# Patient Record
Sex: Female | Born: 1970 | Race: Black or African American | Hispanic: No | Smoking: Never smoker
Health system: Southern US, Community
[De-identification: ages and names within clinical notes are randomized; demographics above are authoritative.]

## PROBLEM LIST (undated history)

## (undated) DIAGNOSIS — I10 Essential (primary) hypertension: Secondary | ICD-10-CM

## (undated) HISTORY — PX: BREAST SURGERY: SHX581

---

## 2012-01-21 ENCOUNTER — Encounter (HOSPITAL_BASED_OUTPATIENT_CLINIC_OR_DEPARTMENT_OTHER): Payer: Self-pay | Admitting: *Deleted

## 2012-01-21 ENCOUNTER — Emergency Department (HOSPITAL_BASED_OUTPATIENT_CLINIC_OR_DEPARTMENT_OTHER)
Admission: EM | Admit: 2012-01-21 | Discharge: 2012-01-21 | Disposition: A | Discharge status: Home / Self Care | Payer: Managed Care, Other (non HMO) | Attending: Emergency Medicine | Admitting: Emergency Medicine

## 2012-01-21 DIAGNOSIS — L03211 Cellulitis of face: Secondary | ICD-10-CM | POA: Insufficient documentation

## 2012-01-21 DIAGNOSIS — L0201 Cutaneous abscess of face: Secondary | ICD-10-CM | POA: Insufficient documentation

## 2012-01-21 MED ORDER — DOXYCYCLINE HYCLATE 100 MG PO CAPS: 100.0000 mg | ORAL_CAPSULE | Freq: Two times a day (BID) | ORAL | Status: AC

## 2012-01-21 MED ORDER — TETANUS-DIPHTH-ACELL PERTUSSIS 5-2.5-18.5 LF-MCG/0.5 IM SUSP
0.5000 mL | Freq: Once | INTRAMUSCULAR | Status: AC
  Administered 2012-01-21: 0.5 mL via INTRAMUSCULAR
  Filled 2012-01-21: qty 0.5

## 2012-01-21 NOTE — Discharge Instructions (Signed)

## 2012-01-21 NOTE — ED Provider Notes (Signed)
History     CSN: [id number]  Arrival date & time 01/21/12  1650   First MD Initiated Contact with Patient 01/21/12 1702      Chief Complaint  Patient presents with  . Abscess    (Consider location/radiation/quality/duration/timing/severity/associated sxs/prior treatment) HPI Comments: Pt states that she has been picking at the area with her hands and it has increased in size:denies fever  Patient is a 41 y.o. female presenting with abscess. The history is provided by the patient. No language interpreter was used.  Abscess  This is a new problem. The current episode started less than one week ago. The onset was gradual. The problem occurs occasionally. The problem has been gradually worsening. The abscess is present on the face. The abscess is characterized by redness, swelling and draining. It is unknown what she was exposed to.    History reviewed. No pertinent past medical history.  Past Surgical History  Procedure Date  . Breast surgery     No family history on file.  History  Substance Use Topics  . Smoking status: Never Smoker   . Smokeless tobacco: Not on file  . Alcohol Use: No    OB History    Grav Para Term Preterm Abortions TAB SAB Ect Mult Living                  Review of Systems  Constitutional: Negative.   HENT:       Swelling to the right jaw  Respiratory: Negative.   Cardiovascular: Negative.   Skin: Positive for wound.    Allergies  Review of patient's allergies indicates no known allergies.  Home Medications   Current Outpatient Rx  Name Route Sig Dispense Refill  . VITAMIN C PO Oral Take 1 tablet by mouth daily.    . ASPIRIN-ACETAMINOPHEN-CAFFEINE 250-250-65 MG PO TABS Oral Take 1 tablet by mouth once as needed. For headache    . PARAGARD INTRAUTERINE COPPER IU IUD Intrauterine 1 each by Intrauterine route once. Inserted in 2012    . TRIPLE ANTIBIOTIC [phone] EX OINT Topical Apply 1 application topically 2 (two) times daily as  needed. To sore    . PHENTERMINE HCL 37.5 MG PO TABS Oral Take 37.5 mg by mouth daily before breakfast.    . DOXYCYCLINE HYCLATE 100 MG PO CAPS Oral Take 1 capsule (100 mg total) by mouth 2 (two) times daily. 14 capsule 0    BP 155/99  Pulse 89  Temp(Src) 99 F (37.2 C) (Oral)  Resp 20  SpO2 100%  Physical Exam  Nursing note and vitals reviewed. Constitutional: She appears well-developed and well-nourished.  HENT:  Right Ear: External ear normal.  Left Ear: External ear normal.  Cardiovascular: Normal rate and regular rhythm.   Pulmonary/Chest: Effort normal and breath sounds normal.  Musculoskeletal: Normal range of motion.  Neurological: She is alert.  Skin:       Pt has swelling and redness noted to the right lower jaw starting at the WUJ:WJXBJY to palpation and draining    ED Course  INCISION AND DRAINAGE Performed by: Teressa Lower Authorized by: Teressa Lower Consent: Verbal consent obtained. Written consent not obtained. Risks and benefits: risks, benefits and alternatives were discussed Consent given by: patient Patient understanding: patient states understanding of the procedure being performed Patient identity confirmed: verbally with patient Time out: Immediately prior to procedure a "time out" was called to verify the correct patient, procedure, equipment, support staff and site/side marked as required. Type: abscess Body  area: head/neck (right chin) Anesthesia: local infiltration Local anesthetic: lidocaine 2% with epinephrine Scalpel size: 11 Incision type: single straight Complexity: simple Drainage: purulent Drainage amount: scant Packing material: Vaseline gauze Patient tolerance: Patient tolerated the procedure well with no immediate complications.   (including critical care time)  Labs Reviewed - No data to display No results found.   1. Facial abscess       MDM  Pt given antibiotics and follow up for worsening  symptoms        Teressa Lower, NP 01/21/12 1743

## 2012-01-21 NOTE — ED Provider Notes (Signed)
Medical screening examination/treatment/procedure(s) were performed by non-physician practitioner and as supervising physician I was immediately available for consultation/collaboration.   Dayton Bailiff, MD 01/21/12 1816

## 2012-01-21 NOTE — ED Notes (Signed)
Abscess to the right side of her face. States it started as pimple and she tried to squeeze it and now it is hot to touch, swollen, red and painful.

## 2012-12-25 ENCOUNTER — Emergency Department (HOSPITAL_BASED_OUTPATIENT_CLINIC_OR_DEPARTMENT_OTHER)
Admission: EM | Admit: 2012-12-25 | Discharge: 2012-12-25 | Disposition: A | Discharge status: Home / Self Care | Payer: Managed Care, Other (non HMO) | Attending: Emergency Medicine | Admitting: Emergency Medicine

## 2012-12-25 ENCOUNTER — Encounter (HOSPITAL_BASED_OUTPATIENT_CLINIC_OR_DEPARTMENT_OTHER): Payer: Self-pay

## 2012-12-25 DIAGNOSIS — H01006 Unspecified blepharitis left eye, unspecified eyelid: Secondary | ICD-10-CM

## 2012-12-25 DIAGNOSIS — H01009 Unspecified blepharitis unspecified eye, unspecified eyelid: Secondary | ICD-10-CM | POA: Insufficient documentation

## 2012-12-25 DIAGNOSIS — Z79899 Other long term (current) drug therapy: Secondary | ICD-10-CM | POA: Insufficient documentation

## 2012-12-25 MED ORDER — BACITRACIN-POLYMYXIN B 500-10000 UNIT/GM OP OINT: TOPICAL_OINTMENT | OPHTHALMIC | Status: DC

## 2012-12-25 NOTE — ED Notes (Signed)
Pt states that she has been having burning, itching, and redness to OS. Pt states that she has had some discharge from the eye.

## 2012-12-25 NOTE — ED Provider Notes (Signed)
History     CSN: [id number]  Arrival date & time 12/25/12  0800   First MD Initiated Contact with Patient 12/25/12 0830      Chief Complaint  Patient presents with  . Eye Problem    (Consider location/radiation/quality/duration/timing/severity/associated sxs/prior treatment) HPI This 42yo female has several days of irritation to her left eyelids mostly the left upper eyelid after she had false eyelashes applied, she did remove the false eyelashes but she still has mild discomfort to the left upper eyelid and minimal discomfort in the left lower eyelid, she has some increased tearing but no vision change and no pain to her left eye itself she is no fever and no significant redness around her eyelids and no redness to the rest of her face, she does not feel ill at all otherwise she is no chest pain shortness breath abdominal pain vomiting or other concerns. There is no treatment prior to arrival other than removing her false eyelashes which caused her problem. History reviewed. No pertinent past medical history.  Past Surgical History  Procedure Laterality Date  . Breast surgery      History reviewed. No pertinent family history.  History  Substance Use Topics  . Smoking status: Never Smoker   . Smokeless tobacco: Not on file  . Alcohol Use: No    OB History   Grav Para Term Preterm Abortions TAB SAB Ect Mult Living                  Review of Systems 10 Systems reviewed and are negative for acute change except as noted in the HPI. Allergies  Review of patient's allergies indicates no known allergies.  Home Medications   Current Outpatient Rx  Name  Route  Sig  Dispense  Refill  . phentermine (ADIPEX-P) 37.5 MG tablet   Oral   Take 37.5 mg by mouth daily before breakfast.         . Ascorbic Acid (VITAMIN C PO)   Oral   Take 1 tablet by mouth daily.         Marland Kitchen aspirin-acetaminophen-caffeine (EXCEDRIN MIGRAINE) 250-250-65 MG per tablet   Oral   Take 1 tablet by  mouth once as needed. For headache         . bacitracin-polymyxin b (POLYSPORIN) ophthalmic ointment   Left Eye   Place into the left eye every 4 (four) hours. apply to left eye every 4 hours while awake x 14 days   3.5 g   0   . IUD's (PARAGARD INTRAUTERINE COPPER) IUD IUD   Intrauterine   1 each by Intrauterine route once. Inserted in 2012         . neomycin-bacitracin-polymyxin (NEOSPORIN) [phone] ointment   Topical   Apply 1 application topically 2 (two) times daily as needed. To sore           BP 143/79  Pulse 75  Temp(Src) 97.9 F (36.6 C) (Oral)  Resp 16  SpO2 99%  Physical Exam  Nursing note and vitals reviewed. Constitutional:  Awake, alert, nontoxic appearance.  HENT:  Head: Atraumatic.  Eyes: Right eye exhibits no discharge. Left eye exhibits no discharge.  Pupils reactive, extraocular movements intact, minimal injection to the left conjunctiva sclera, minimal edema to the left upper and lower eyelids with no external erythema or induration but minimal tenderness with eversion of the left upper lid with injection of the mucosal surface of the left upper eyelid with no foreign body noted or purulent  drainage to left eye and no periorbital tenderness with patient denying any visual change corneal foreign body sensation or globe pain  Neck: Neck supple.  Cardiovascular: Normal rate and regular rhythm.   No murmur heard. Pulmonary/Chest: Effort normal. She exhibits no tenderness.  Abdominal: Soft. There is no tenderness. There is no rebound.  Musculoskeletal: She exhibits no tenderness.  Baseline ROM, no obvious new focal weakness.  Neurological:  Mental status and motor strength appears baseline for patient and situation.  Skin: No rash noted.  Psychiatric: She has a normal mood and affect.    ED Course  Procedures (including critical care time)  Labs Reviewed - No data to display No results found.   1. Blepharitis of left eye       MDM   Patient informed of clinical course, understand medical decision-making process, and agree with plan. I doubt any other EMC precluding discharge at this time including, but not necessarily limited to the following:orbital/periorbital celllulitis.        Hurman Horn, MD 12/25/12 2046

## 2013-12-27 ENCOUNTER — Encounter (HOSPITAL_BASED_OUTPATIENT_CLINIC_OR_DEPARTMENT_OTHER): Payer: Self-pay | Admitting: Emergency Medicine

## 2013-12-27 ENCOUNTER — Emergency Department (HOSPITAL_BASED_OUTPATIENT_CLINIC_OR_DEPARTMENT_OTHER)
Admission: EM | Admit: 2013-12-27 | Discharge: 2013-12-27 | Disposition: A | Discharge status: Home / Self Care | Payer: Managed Care, Other (non HMO) | Attending: Emergency Medicine | Admitting: Emergency Medicine

## 2013-12-27 DIAGNOSIS — J069 Acute upper respiratory infection, unspecified: Secondary | ICD-10-CM | POA: Insufficient documentation

## 2013-12-27 DIAGNOSIS — Z79899 Other long term (current) drug therapy: Secondary | ICD-10-CM | POA: Insufficient documentation

## 2013-12-27 DIAGNOSIS — R1110 Vomiting, unspecified: Secondary | ICD-10-CM | POA: Insufficient documentation

## 2013-12-27 LAB — RAPID STREP SCREEN (MED CTR MEBANE ONLY): Streptococcus, Group A Screen (Direct): NEGATIVE

## 2013-12-27 NOTE — Discharge Instructions (Signed)

## 2013-12-27 NOTE — ED Notes (Signed)
Pt reports URI, cough that is causing her to vomit and generalized body aches x several days.  Denies fever at home.

## 2013-12-27 NOTE — ED Provider Notes (Signed)
CSN: [id number]     Arrival date & time 12/27/13  [id number] History   First MD Initiated Contact with Patient 12/27/13 [phone]     Chief Complaint  Patient presents with  . URI     (Consider location/radiation/quality/duration/timing/severity/associated sxs/prior Treatment) Patient is a 43 y.o. female presenting with URI.  URI  Pt with no significant PMH reports 2-3 days of nasal congestion, sore throat, productive cough, occasional post-tussive emesis and body aches. Has not had a measured fever at home. Was around a sick grandson last week. Has not taken OTC meds this morning.   History reviewed. No pertinent past medical history. Past Surgical History  Procedure Laterality Date  . Breast surgery     History reviewed. No pertinent family history. History  Substance Use Topics  . Smoking status: Never Smoker   . Smokeless tobacco: Not on file  . Alcohol Use: No   OB History   Grav Para Term Preterm Abortions TAB SAB Ect Mult Living                 Review of Systems All other systems reviewed and are negative except as noted in HPI.     Allergies  Review of patient's allergies indicates no known allergies.  Home Medications   Prior to Admission medications   Medication Sig Start Date End Date Taking? Authorizing Provider  phentermine (ADIPEX-P) 37.5 MG tablet Take 37.5 mg by mouth daily before breakfast.    Historical Provider, MD   BP 130/72  Pulse 92  Temp(Src) 99.9 F (37.7 C) (Oral)  Resp 18  Ht 5\' 6"  (1.676 m)  Wt 180 lb (81.647 kg)  BMI 29.07 kg/m2  SpO2 100% Physical Exam  Nursing note and vitals reviewed. Constitutional: She is oriented to person, place, and time. She appears well-developed and well-nourished.  HENT:  Head: Normocephalic and atraumatic.  Mouth/Throat: No oropharyngeal exudate.  Eyes: EOM are normal. Pupils are equal, round, and reactive to light.  Neck: Normal range of motion. Neck supple.  Cardiovascular: Normal rate, normal heart  sounds and intact distal pulses.   Pulmonary/Chest: Effort normal and breath sounds normal.  Abdominal: Bowel sounds are normal. She exhibits no distension. There is no tenderness.  Musculoskeletal: Normal range of motion. She exhibits no edema and no tenderness.  Lymphadenopathy:    She has no cervical adenopathy.  Neurological: She is alert and oriented to person, place, and time. She has normal strength. No cranial nerve deficit or sensory deficit.  Skin: Skin is warm and dry. No rash noted.  Psychiatric: She has a normal mood and affect.    ED Course  Procedures (including critical care time) Labs Review Labs Reviewed  RAPID STREP SCREEN  CULTURE, GROUP A STREP    Imaging Review No results found.   EKG Interpretation None      MDM   Final diagnoses:  Viral URI    Strep neg, likely viral URI. Recommend rest, fluids and OTC symptomatic relief.     Charles B. Bernette MayersSheldon, MD 12/27/13 1013

## 2013-12-27 NOTE — ED Notes (Signed)
MD at bedside. 

## 2013-12-29 LAB — CULTURE, GROUP A STREP

## 2015-02-26 ENCOUNTER — Emergency Department (HOSPITAL_COMMUNITY): Payer: Managed Care, Other (non HMO)

## 2015-02-26 ENCOUNTER — Emergency Department (HOSPITAL_COMMUNITY)
Admission: EM | Admit: 2015-02-26 | Discharge: 2015-02-26 | Disposition: A | Discharge status: Home / Self Care | Payer: Managed Care, Other (non HMO) | Attending: Emergency Medicine | Admitting: Emergency Medicine

## 2015-02-26 ENCOUNTER — Encounter (HOSPITAL_COMMUNITY): Payer: Self-pay

## 2015-02-26 DIAGNOSIS — S92252A Displaced fracture of navicular [scaphoid] of left foot, initial encounter for closed fracture: Secondary | ICD-10-CM

## 2015-02-26 DIAGNOSIS — S3992XA Unspecified injury of lower back, initial encounter: Secondary | ICD-10-CM | POA: Diagnosis not present

## 2015-02-26 DIAGNOSIS — Y998 Other external cause status: Secondary | ICD-10-CM | POA: Insufficient documentation

## 2015-02-26 DIAGNOSIS — S99922A Unspecified injury of left foot, initial encounter: Secondary | ICD-10-CM | POA: Diagnosis present

## 2015-02-26 DIAGNOSIS — M545 Low back pain, unspecified: Secondary | ICD-10-CM

## 2015-02-26 DIAGNOSIS — Y9389 Activity, other specified: Secondary | ICD-10-CM | POA: Diagnosis not present

## 2015-02-26 DIAGNOSIS — S299XXA Unspecified injury of thorax, initial encounter: Secondary | ICD-10-CM | POA: Insufficient documentation

## 2015-02-26 DIAGNOSIS — I10 Essential (primary) hypertension: Secondary | ICD-10-CM | POA: Insufficient documentation

## 2015-02-26 DIAGNOSIS — S30810A Abrasion of lower back and pelvis, initial encounter: Secondary | ICD-10-CM | POA: Diagnosis not present

## 2015-02-26 DIAGNOSIS — Y92410 Unspecified street and highway as the place of occurrence of the external cause: Secondary | ICD-10-CM | POA: Insufficient documentation

## 2015-02-26 DIAGNOSIS — Z79899 Other long term (current) drug therapy: Secondary | ICD-10-CM | POA: Insufficient documentation

## 2015-02-26 HISTORY — DX: Essential (primary) hypertension: I10

## 2015-02-26 MED ORDER — HYDROCODONE-ACETAMINOPHEN 5-325 MG PO TABS
1.0000 | ORAL_TABLET | Freq: Once | ORAL | Status: AC
Start: 2015-02-26 — End: 2015-02-26
  Administered 2015-02-26: 1 via ORAL
  Filled 2015-02-26: qty 1

## 2015-02-26 MED ORDER — METHOCARBAMOL 500 MG PO TABS: 500.0000 mg | ORAL_TABLET | Freq: Three times a day (TID) | ORAL | Status: DC | PRN

## 2015-02-26 MED ORDER — IBUPROFEN 600 MG PO TABS
600.0000 mg | ORAL_TABLET | Freq: Three times a day (TID) | ORAL | Status: AC | PRN
Start: 2015-02-26 — End: ?

## 2015-02-26 MED ORDER — IBUPROFEN 200 MG PO TABS
600.0000 mg | ORAL_TABLET | Freq: Once | ORAL | Status: AC
  Administered 2015-02-26: 600 mg via ORAL
  Filled 2015-02-26: qty 3

## 2015-02-26 MED ORDER — DIAZEPAM 5 MG PO TABS
5.0000 mg | ORAL_TABLET | Freq: Once | ORAL | Status: AC
  Administered 2015-02-26: 5 mg via ORAL
  Filled 2015-02-26: qty 1
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
## 2015-02-26 NOTE — ED Notes (Signed)
Pt reporting pain and swelling in left foot. MD aware.
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
## 2017-06-11 ENCOUNTER — Emergency Department (HOSPITAL_BASED_OUTPATIENT_CLINIC_OR_DEPARTMENT_OTHER): Payer: 59

## 2017-06-11 ENCOUNTER — Encounter (HOSPITAL_BASED_OUTPATIENT_CLINIC_OR_DEPARTMENT_OTHER): Payer: Self-pay | Admitting: Emergency Medicine

## 2017-06-11 ENCOUNTER — Emergency Department (HOSPITAL_BASED_OUTPATIENT_CLINIC_OR_DEPARTMENT_OTHER)
Admission: EM | Admit: 2017-06-11 | Discharge: 2017-06-11 | Disposition: A | Discharge status: Home / Self Care | Payer: 59 | Attending: Emergency Medicine | Admitting: Emergency Medicine

## 2017-06-11 DIAGNOSIS — B9789 Other viral agents as the cause of diseases classified elsewhere: Secondary | ICD-10-CM | POA: Diagnosis not present

## 2017-06-11 DIAGNOSIS — I10 Essential (primary) hypertension: Secondary | ICD-10-CM | POA: Diagnosis not present

## 2017-06-11 DIAGNOSIS — J069 Acute upper respiratory infection, unspecified: Secondary | ICD-10-CM | POA: Diagnosis not present

## 2017-06-11 DIAGNOSIS — R05 Cough: Secondary | ICD-10-CM | POA: Diagnosis present

## 2017-06-11 NOTE — ED Triage Notes (Signed)
Cough, runny nose and congestion x 1 week. Took OTC sinus med at 0500

## 2017-06-11 NOTE — Discharge Instructions (Signed)
Please read instructions below. You can take tylenol as needed for body aches. Use saline nasal spray or flonase for congestion. Drink plenty of water. Follow up with your primary care provider as needed if symptoms persist. Return to the ER for difficulty swallowing liquids, difficulty breathing, or new or worsening symptoms.

## 2017-06-11 NOTE — ED Provider Notes (Signed)
[facility] Provider Note   CSN: [id number] Arrival date & time: 06/11/17  6295     History   Chief Complaint Chief Complaint  Patient presents with  . URI    HPI Traci Austin is a 46 y.o. female w PMHx HTN, presenting to the ED With gradually worsening ingestion and myalgias. Patient states she had a cold last week, with 3 days of symptom improvement and relief, however began feeling ill again this weekend. She reports taking over-the-counter sinus medication and Alka-Seltzer this morning, with moderate relief of symptoms. She reports associated productive cough of white sputum, myalgias, and headache. She denies fever, vision changes, chest pain or shortness of breath, difficulty swallowing, sore throat, ear pain, neck pain or stiffness, abdominal pain, urinary symptoms, or other complaints today.  The history is provided by the patient.    Past Medical History:  Diagnosis Date  . Hypertension     There are no active problems to display for this patient.   Past Surgical History:  Procedure Laterality Date  . BREAST SURGERY      OB History    No data available       Home Medications    Prior to Admission medications   Medication Sig Start Date End Date Taking? Authorizing Provider  acetaminophen (TYLENOL) 325 MG tablet Take 650 mg by mouth every 6 (six) hours as needed for mild pain.    [provider]  ibuprofen (ADVIL,MOTRIN) 600 MG tablet Take 1 tablet (600 mg total) by mouth every 8 (eight) hours as needed. 02/26/15   Azalia Bilis, MD  methocarbamol (ROBAXIN) 500 MG tablet Take 1 tablet (500 mg total) by mouth every 8 (eight) hours as needed for muscle spasms. 02/26/15   Azalia Bilis, MD    Family History No family history on file.  Social History Social History  Substance Use Topics  . Smoking status: Never Smoker  . Smokeless tobacco: Never Used  . Alcohol use No     Allergies   Patient has no known allergies.   Review of  Systems Review of Systems  Constitutional: Negative for chills and fever.  HENT: Positive for congestion. Negative for ear pain, rhinorrhea, sore throat, trouble swallowing and voice change.   Eyes: Negative for visual disturbance.  Respiratory: Positive for cough. Negative for shortness of breath.   Cardiovascular: Negative for chest pain.  Gastrointestinal: Negative for abdominal pain.  Genitourinary: Negative for dysuria and frequency.  Musculoskeletal: Positive for myalgias. Negative for neck pain and neck stiffness.  Neurological: Positive for headaches.     Physical Exam Updated Vital Signs BP 132/71   Pulse 91   Temp 99 F (37.2 C) (Oral)   Resp 18   Ht  (1.676 m)   Wt 76.2 kg (168 lb)   SpO2 100%   BMI 27.12 kg/m   Physical Exam  Constitutional: She is oriented to person, place, and time. She appears well-developed and well-nourished. No distress.  Tolerating secretions  HENT:  Head: Normocephalic and atraumatic.  Right Ear: Hearing and external ear normal.  Left Ear: Hearing and external ear normal.  Nose: Nose normal.  Mouth/Throat: Uvula is midline. No trismus in the jaw. No uvula swelling. Posterior oropharyngeal erythema (Mild) present. No posterior oropharyngeal edema. No tonsillar exudate.  Unable to visualize TMs secondary to cerumen.  Eyes: Pupils are equal, round, and reactive to light. Conjunctivae and EOM are normal.  Neck: Normal range of motion. Neck supple. No tracheal deviation present.  Cardiovascular: Normal rate, regular rhythm, normal heart sounds and intact distal pulses.  Exam reveals no friction rub.   No murmur heard. Pulmonary/Chest: Effort normal and breath sounds normal. No stridor. No respiratory distress. She has no wheezes. She has no rales.  Abdominal: Soft. Bowel sounds are normal. She exhibits no distension. There is no tenderness.  Lymphadenopathy:    She has cervical adenopathy (mild).  Neurological: She is alert and  oriented to person, place, and time.  Psychiatric: She has a normal mood and affect. Her behavior is normal.  Nursing note and vitals reviewed.    ED Treatments / Results  Labs (all labs ordered are listed, but only abnormal results are displayed) Labs Reviewed - No data to display  EKG  EKG Interpretation None       Radiology Dg Chest 2 View  Result Date: 06/11/2017 CLINICAL DATA:  Cough.  Headache.  Chills. EXAM: CHEST  2 VIEW COMPARISON:  Thoracic spine radiographs 02/26/2015 FINDINGS: The cardiomediastinal silhouette is within normal limits. The lungs are well inflated and clear. There is no evidence of pleural effusion or pneumothorax. No acute osseous abnormality is identified. IMPRESSION: No active cardiopulmonary disease. Electronically Signed   By: Sebastian Ache M.D.   On: 06/11/2017 09:37    Procedures Procedures (including critical care time)  Medications Ordered in ED Medications - No data to display   Initial Impression / Assessment and Plan / ED Course  I have reviewed the triage vital signs and the nursing notes.  Pertinent labs & imaging results that were available during my care of the patient were reviewed by me and considered in my medical decision making (see chart for details).      Patients symptoms are consistent with URI, likely viral etiology. Pt CXR negative for acute infiltrate. Discussed that antibiotics are not indicated for viral infections. Pt will be discharged with symptomatic treatment.  Verbalizes understanding and is agreeable with plan. Pt is hemodynamically stable & in NAD prior to dc.  Discussed results, findings, treatment and follow up. Patient advised of return precautions. Patient verbalized understanding and agreed with plan.  Final Clinical Impressions(s) / ED Diagnoses   Final diagnoses:  Viral URI with cough    New Prescriptions New Prescriptions   No medications on file     Russo, [place] N, PA-C 06/11/17 1610      Tegeler, Canary Brim, MD 06/11/17 1940

## 2019-06-08 ENCOUNTER — Other Ambulatory Visit (HOSPITAL_COMMUNITY): Payer: Self-pay | Admitting: Plastic Surgery

## 2019-06-08 DIAGNOSIS — Z9889 Other specified postprocedural states: Secondary | ICD-10-CM

## 2019-06-11 ENCOUNTER — Other Ambulatory Visit: Payer: Self-pay | Admitting: Student

## 2019-06-11 ENCOUNTER — Encounter (HOSPITAL_COMMUNITY): Payer: Self-pay

## 2019-06-14 ENCOUNTER — Encounter (HOSPITAL_COMMUNITY): Payer: Self-pay

## 2019-06-14 ENCOUNTER — Telehealth (HOSPITAL_COMMUNITY): Payer: Self-pay

## 2019-06-14 ENCOUNTER — Other Ambulatory Visit (HOSPITAL_COMMUNITY): Payer: Self-pay | Admitting: Plastic Surgery

## 2019-06-14 ENCOUNTER — Ambulatory Visit (HOSPITAL_COMMUNITY)
Admission: RE | Admit: 2019-06-14 | Discharge: 2019-06-14 | Disposition: A | Discharge status: Home / Self Care | Payer: 59 | Source: Ambulatory Visit | Attending: Plastic Surgery | Admitting: Plastic Surgery

## 2019-06-14 ENCOUNTER — Other Ambulatory Visit: Payer: Self-pay | Admitting: Radiology

## 2019-06-14 ENCOUNTER — Other Ambulatory Visit: Payer: Self-pay

## 2019-06-14 DIAGNOSIS — I10 Essential (primary) hypertension: Secondary | ICD-10-CM | POA: Insufficient documentation

## 2019-06-14 DIAGNOSIS — L7632 Postprocedural hematoma of skin and subcutaneous tissue following other procedure: Secondary | ICD-10-CM | POA: Diagnosis not present

## 2019-06-14 DIAGNOSIS — Z9889 Other specified postprocedural states: Secondary | ICD-10-CM

## 2019-06-14 HISTORY — PX: IR US GUIDE BX ASP/DRAIN: IMG2392

## 2019-06-14 LAB — CBC
HCT: 33.2 % — ABNORMAL LOW (ref 36.0–46.0)
Hemoglobin: 10.7 g/dL — ABNORMAL LOW (ref 12.0–15.0)
MCH: 29.7 pg (ref 26.0–34.0)
MCHC: 32.2 g/dL (ref 30.0–36.0)
MCV: 92.2 fL (ref 80.0–100.0)
Platelets: 286 10*3/uL (ref 150–400)
RBC: 3.6 MIL/uL — ABNORMAL LOW (ref 3.87–5.11)
RDW: 13 % (ref 11.5–15.5)
WBC: 4.5 10*3/uL (ref 4.0–10.5)
nRBC: 0 % (ref 0.0–0.2)

## 2019-06-14 LAB — PROTIME-INR
INR: 1 (ref 0.8–1.2)
Prothrombin Time: 13.1 seconds (ref 11.4–15.2)

## 2019-06-14 LAB — PREGNANCY, URINE: Preg Test, Ur: NEGATIVE

## 2019-06-14 MED ORDER — FENTANYL CITRATE (PF) 100 MCG/2ML IJ SOLN
INTRAMUSCULAR | Status: AC
  Filled 2019-06-14: qty 2

## 2019-06-14 MED ORDER — SODIUM CHLORIDE 0.9 % IV SOLN: INTRAVENOUS | Status: DC

## 2019-06-14 MED ORDER — CEFAZOLIN SODIUM-DEXTROSE 2-4 GM/100ML-% IV SOLN
INTRAVENOUS | Status: AC
  Filled 2019-06-14: qty 100

## 2019-06-14 MED ORDER — MIDAZOLAM HCL 2 MG/2ML IJ SOLN
INTRAMUSCULAR | Status: AC | PRN
  Administered 2019-06-14: 0.5 mg via INTRAVENOUS
  Administered 2019-06-14: 1 mg via INTRAVENOUS

## 2019-06-14 MED ORDER — MIDAZOLAM HCL 2 MG/2ML IJ SOLN
INTRAMUSCULAR | Status: AC
  Filled 2019-06-14: qty 2

## 2019-06-14 MED ORDER — LIDOCAINE HCL 1 % IJ SOLN
INTRAMUSCULAR | Status: AC
  Filled 2019-06-14: qty 20

## 2019-06-14 MED ORDER — LIDOCAINE HCL 1 % IJ SOLN
INTRAMUSCULAR | Status: AC | PRN
  Administered 2019-06-14: 5 mL

## 2019-06-14 MED ORDER — FENTANYL CITRATE (PF) 100 MCG/2ML IJ SOLN
INTRAMUSCULAR | Status: AC | PRN
  Administered 2019-06-14: 25 ug via INTRAVENOUS
  Administered 2019-06-14: 50 ug via INTRAVENOUS

## 2019-06-14 MED ORDER — CEFAZOLIN SODIUM-DEXTROSE 2-4 GM/100ML-% IV SOLN
2.0000 g | Freq: Once | INTRAVENOUS | Status: AC
  Administered 2019-06-14: 11:00:00 2 g via INTRAVENOUS

## 2019-06-14 NOTE — Telephone Encounter (Signed)
-----   Message from Lenore Cordia sent at 06/14/2019 10:40 AM EDT ----- Regarding: FW: US Abdominal Aspiration  ----- Message ----- From: Lenore Cordia Sent: 06/11/2019   1:03 PM EDT To: Joanell Rising Subject: FW: US Abdominal Aspiration                     ----- Message ----- From: Aletta Edouard, MD Sent: 06/10/2019   5:36 PM EDT To: Lenore Cordia Subject: RE: US Abdominal Aspiration                    Evaluate with soft tissue US first for possible abdominal wall seroma. If fluid collection, ok for US guided aspiration.  GY  ----- Message ----- From: Lenore Cordia Sent: 06/10/2019   3:13 PM EDT To: [facility] Procedure Requests Subject: US Abdominal Aspiration                        Procedure Requested: [place] of Abdomen with possible aspiration and placement of drainage tube    Reason for Procedure: patient recently had and abdominoplasty surgery on 01/21/2019    Provider Requesting: Dr Audrea Muscat Contogiannis  Provider Telephone: [phone]    Other Info:

## 2019-06-14 NOTE — Progress Notes (Signed)
Pt states understanding of instructions. Bandaid over site with small bloody spot but not spreading. Offers no complaints.

## 2019-06-14 NOTE — H&P (Signed)
Chief Complaint: Patient was seen in consultation today for abdominal fluid collection aspiration/drain placement at the request of Contogiannis,Mary Ann  Referring Physician(s): Contogiannis,Mary Ann  Supervising Physician: Malachy Moan  Patient Status: [facility] - Out-pt  History of Present Illness: Traci Austin is a 48 y.o. female   Abdominoplasty 01/21/19 Persistent distension; pain "fluid collection" per notes No imaging available Probable seroma  Dr Sherald Hess discussed with Dr Grace Isaac Now scheduled for collection aspiration/ drain placement  Past Medical History:  Diagnosis Date  . Hypertension     Past Surgical History:  Procedure Laterality Date  . BREAST SURGERY      Allergies: Patient has no known allergies.  Medications: Prior to Admission medications   Medication Sig Start Date End Date Taking? Authorizing Provider  acetaminophen (TYLENOL) 325 MG tablet Take 650 mg by mouth every 6 (six) hours as needed for mild pain.    [provider]  ibuprofen (ADVIL,MOTRIN) 600 MG tablet Take 1 tablet (600 mg total) by mouth every 8 (eight) hours as needed. 02/26/15   Azalia Bilis, MD  methocarbamol (ROBAXIN) 500 MG tablet Take 1 tablet (500 mg total) by mouth every 8 (eight) hours as needed for muscle spasms. 02/26/15   Azalia Bilis, MD     History reviewed. No pertinent family history.  Social History   Socioeconomic History  . Marital status: Divorced    Spouse name: Not on file  . Number of children: Not on file  . Years of education: Not on file  . Highest education level: Not on file  Occupational History  . Not on file  Social Needs  . Financial resource strain: Not on file  . Food insecurity    Worry: Not on file    Inability: Not on file  . Transportation needs    Medical: Not on file    Non-medical: Not on file  Tobacco Use  . Smoking status: Never Smoker  . Smokeless tobacco: Never Used  Substance and Sexual Activity  .  Alcohol use: No  . Drug use: No  . Sexual activity: Not on file  Lifestyle  . Physical activity    Days per week: Not on file    Minutes per session: Not on file  . Stress: Not on file  Relationships  . Social Musician on phone: Not on file    Gets together: Not on file    Attends religious service: Not on file    Active member of club or organization: Not on file    Attends meetings of clubs or organizations: Not on file    Relationship status: Not on file  Other Topics Concern  . Not on file  Social History Narrative  . Not on file    Review of Systems: A 12 point ROS discussed and pertinent positives are indicated in the HPI above.  All other systems are negative.  Review of Systems  Constitutional: Positive for activity change, appetite change and unexpected weight change. Negative for fatigue.  Respiratory: Negative for cough and shortness of breath.   Gastrointestinal: Positive for abdominal distention and abdominal pain.  Neurological: Negative for weakness.  Psychiatric/Behavioral: Negative for behavioral problems and confusion.    Vital Signs: BP (!) 158/87   Pulse 64   Temp (!) 97.3 F (36.3 C) (Skin)   Resp 18   Ht 5\' 6"  (1.676 m)   Wt 172 lb (78 kg)   SpO2 100%   BMI 27.76 kg/m   Physical  Exam Vitals signs reviewed.  Cardiovascular:     Rate and Rhythm: Normal rate and regular rhythm.     Heart sounds: Normal heart sounds.  Pulmonary:     Effort: Pulmonary effort is normal.     Breath sounds: Normal breath sounds.  Abdominal:     General: There is distension.     Tenderness: There is abdominal tenderness.  Musculoskeletal: Normal range of motion.  Skin:    General: Skin is warm and dry.  Neurological:     Mental Status: She is alert and oriented to person, place, and time.  Psychiatric:        Mood and Affect: Mood normal.        Behavior: Behavior normal.        Thought Content: Thought content normal.        Judgment: Judgment  normal.     Imaging: No results found.  Labs:  CBC: No results for input(s): WBC, HGB, HCT, PLT in the last 8760 hours.  COAGS: No results for input(s): INR, APTT in the last 8760 hours.  BMP: No results for input(s): NA, K, CL, CO2, GLUCOSE, BUN, CALCIUM, CREATININE, GFRNONAA, GFRAA in the last 8760 hours.  Invalid input(s): CMP  LIVER FUNCTION TESTS: No results for input(s): BILITOT, AST, ALT, ALKPHOS, PROT, ALBUMIN in the last 8760 hours.  TUMOR MARKERS: No results for input(s): AFPTM, CEA, CA199, CHROMGRNA in the last 8760 hours.  Assessment and Plan:  Post surgical abdominoplasty 01/21/19 Abdominal distension and pain Fluid collection per Dr Kirk Ruths She discussed with Dr Pascal Lux--- Scheduled for aspiration/drain placement in IR Pt is aware of procedure benefits and risks including but not limited to Infection; bleeding; damage to surrounding structures Agreeable to proceed Consent signed in chart  Thank you for this interesting consult.  I greatly enjoyed meeting [employer] and look forward to participating in their care.  A copy of this report was sent to the requesting provider on this date.  Electronically Signed: Lavonia Drafts, PA-C 06/14/2019, 10:34 AM   I spent a total of  30 Minutes   in face to face in clinical consultation, greater than 50% of which was counseling/coordinating care for abdominal fluid collection aspiration/drain placement

## 2019-06-14 NOTE — Discharge Instructions (Signed)
Moderate Conscious Sedation, Adult, Care After These instructions provide you with information about caring for yourself after your procedure. Your health care provider may also give you more specific instructions. Your treatment has been planned according to current medical practices, but problems sometimes occur. Call your health care provider if you have any problems or questions after your procedure. What can I expect after the procedure? After your procedure, it is common:  To feel sleepy for several hours.  To feel clumsy and have poor balance for several hours.  To have poor judgment for several hours.  To vomit if you eat too soon. Follow these instructions at home: For at least 24 hours after the procedure:   Do not: ? Participate in activities where you could fall or become injured. ? Drive. ? Use heavy machinery. ? Drink alcohol. ? Take sleeping pills or medicines that cause drowsiness. ? Make important decisions or sign legal documents. ? Take care of children on your own.  Rest. Eating and drinking  Follow the diet recommended by your health care provider.  If you vomit: ? Drink water, juice, or soup when you can drink without vomiting. ? Make sure you have little or no nausea before eating solid foods. General instructions  Have a responsible adult stay with you until you are awake and alert.  Take over-the-counter and prescription medicines only as told by your health care provider.  If you smoke, do not smoke without supervision.  Keep all follow-up visits as told by your health care provider. This is important. Contact a health care provider if:  You keep feeling nauseous or you keep vomiting.  You feel light-headed.  You develop a rash.  You have a fever. Get help right away if:  You have trouble breathing. This information is not intended to replace advice given to you by your health care provider. Make sure you discuss any questions you have  with your health care provider. Document Released: 06/16/2013 Document Revised: 08/08/2017 Document Reviewed: 12/16/2015 Elsevier Patient Education  2020 [place].  Assess wound daily for signs of infection...redness, swelling, fever and report to your doctor if so. Ok to shower tomorrow and remove bandage. Limit strenuous activity for 5 days.

## 2019-06-14 NOTE — Procedures (Signed)
Interventional Radiology Procedure Note  Procedure: US aspiration of abdominal wall hematoma. Several hundred CC's removed.   Complications: None  Estimated Blood Loss: None  Recommendations: - DC home  Signed,  Criselda Peaches, MD

## 2019-06-17 ENCOUNTER — Other Ambulatory Visit (HOSPITAL_COMMUNITY): Payer: Self-pay | Admitting: Plastic Surgery

## 2019-06-17 ENCOUNTER — Other Ambulatory Visit: Payer: Self-pay | Admitting: Plastic Surgery

## 2019-06-17 DIAGNOSIS — R198 Other specified symptoms and signs involving the digestive system and abdomen: Secondary | ICD-10-CM

## 2019-06-21 ENCOUNTER — Other Ambulatory Visit: Payer: Self-pay

## 2019-06-21 ENCOUNTER — Encounter (HOSPITAL_COMMUNITY): Payer: Self-pay

## 2019-06-21 ENCOUNTER — Ambulatory Visit (HOSPITAL_COMMUNITY)
Admission: RE | Admit: 2019-06-21 | Discharge: 2019-06-21 | Disposition: A | Discharge status: Home / Self Care | Payer: 59 | Source: Ambulatory Visit | Attending: Plastic Surgery | Admitting: Plastic Surgery

## 2019-06-21 DIAGNOSIS — R198 Other specified symptoms and signs involving the digestive system and abdomen: Secondary | ICD-10-CM

## 2019-06-21 MED ORDER — SODIUM CHLORIDE (PF) 0.9 % IJ SOLN
INTRAMUSCULAR | Status: AC
  Filled 2019-06-21: qty 50

## 2019-06-21 MED ORDER — IOHEXOL 300 MG/ML  SOLN
100.0000 mL | Freq: Once | INTRAMUSCULAR | Status: AC | PRN
  Administered 2019-06-21: 100 mL via INTRAVENOUS

## 2019-07-16 ENCOUNTER — Other Ambulatory Visit (HOSPITAL_COMMUNITY): Payer: Self-pay | Admitting: Plastic Surgery

## 2019-07-16 DIAGNOSIS — R188 Other ascites: Secondary | ICD-10-CM

## 2019-07-19 ENCOUNTER — Encounter (HOSPITAL_COMMUNITY): Payer: Self-pay | Admitting: Radiology

## 2019-07-19 NOTE — Progress Notes (Unsigned)
[employer] Female, 48 y.o., 02/15/1971 MRN:  [id number] Phone:  [phone] Jerilynn Mages) PCP:  Patient, No Pcp Per Coverage:  [employer] Nap Next Appt With Radiology ([facility] 2) 07/23/2019 at 8:00 AM  RE: US Aspiration Received: 3 days ago Message Contents  Markus Daft, MD  Jillyn Hidden        Ok to schedule drain. Dr. Nathanial Rancher wants a drain and possible sclerotherapy. Would schedule for drain placement at this time.   [place]   Previous Messages  ----- Message -----  From: Garth Bigness D  Sent: 07/16/2019 12:17 PM EST  To: Markus Daft, MD  Subject: US Aspiration                   Procedure:  US Aspiration   Reason: Fluid Collection, Abscess, Patient had an Abdominoplasty surgery on 01/21/2019   History:  CT, IR US Guided BX/Drain in computer   Dr. Nathanial Rancher, [place]  [phone]

## 2019-07-22 ENCOUNTER — Other Ambulatory Visit: Payer: Self-pay | Admitting: Physician Assistant

## 2019-07-23 ENCOUNTER — Other Ambulatory Visit: Payer: Self-pay | Admitting: Plastic Surgery

## 2019-07-23 ENCOUNTER — Other Ambulatory Visit: Payer: Self-pay

## 2019-07-23 ENCOUNTER — Ambulatory Visit (HOSPITAL_COMMUNITY)
Admission: RE | Admit: 2019-07-23 | Discharge: 2019-07-23 | Disposition: A | Discharge status: Home / Self Care | Payer: 59 | Source: Ambulatory Visit | Attending: Plastic Surgery | Admitting: Plastic Surgery

## 2019-07-23 VITALS — BP 117/86 | HR 64 | Temp 97.7°F | Resp 16 | Ht 66.0 in | Wt 182.0 lb

## 2019-07-23 DIAGNOSIS — T8143XA Infection following a procedure, organ and space surgical site, initial encounter: Secondary | ICD-10-CM | POA: Diagnosis present

## 2019-07-23 DIAGNOSIS — R188 Other ascites: Secondary | ICD-10-CM | POA: Diagnosis present

## 2019-07-23 LAB — CBC
HCT: 34.4 % — ABNORMAL LOW (ref 36.0–46.0)
Hemoglobin: 11.1 g/dL — ABNORMAL LOW (ref 12.0–15.0)
MCH: 29.7 pg (ref 26.0–34.0)
MCHC: 32.3 g/dL (ref 30.0–36.0)
MCV: 92 fL (ref 80.0–100.0)
Platelets: 324 10*3/uL (ref 150–400)
RBC: 3.74 MIL/uL — ABNORMAL LOW (ref 3.87–5.11)
RDW: 13.8 % (ref 11.5–15.5)
WBC: 3.8 10*3/uL — ABNORMAL LOW (ref 4.0–10.5)
nRBC: 0 % (ref 0.0–0.2)

## 2019-07-23 LAB — PROTIME-INR
INR: 0.9 (ref 0.8–1.2)
Prothrombin Time: 12.4 seconds (ref 11.4–15.2)

## 2019-07-23 LAB — PREGNANCY, URINE: Preg Test, Ur: NEGATIVE

## 2019-07-23 MED ORDER — MIDAZOLAM HCL 2 MG/2ML IJ SOLN
INTRAMUSCULAR | Status: AC | PRN
  Administered 2019-07-23: 1 mg via INTRAVENOUS

## 2019-07-23 MED ORDER — SODIUM CHLORIDE 0.9% FLUSH: 5.0000 mL | Freq: Three times a day (TID) | INTRAVENOUS | Status: DC

## 2019-07-23 MED ORDER — MIDAZOLAM HCL 2 MG/2ML IJ SOLN
INTRAMUSCULAR | Status: AC
  Filled 2019-07-23: qty 2

## 2019-07-23 MED ORDER — FENTANYL CITRATE (PF) 100 MCG/2ML IJ SOLN
INTRAMUSCULAR | Status: AC
  Filled 2019-07-23: qty 2

## 2019-07-23 MED ORDER — LIDOCAINE HCL (PF) 1 % IJ SOLN
INTRAMUSCULAR | Status: AC
  Filled 2019-07-23: qty 30

## 2019-07-23 MED ORDER — SODIUM CHLORIDE 0.9 % IV SOLN: INTRAVENOUS | Status: DC

## 2019-07-23 NOTE — H&P (Signed)
Chief Complaint: Patient was seen in consultation today for image guided abscess aspiration/possible drain placement.  Referring Physician(s): Austin,Traci Ann  Supervising Physician: Gilmer Austin, Traci  [facility]  History of Present Illness: Traci Austin is a 48 y.o. female with a past medical history significant for HTN, abdominoplasty 01/21/19 with Dr. Sherald Austin and US guided aspiration of abdominal wall hematoma 06/14/19 with Dr. Archer Austin who presents today for a repeat image guided abscess aspiration with likely drain placement. Ms. Traci Austin reports that she continues to have abdominal distention and some pain which is mild and described as soreness. She states that after her previous procedure in IR she felt much better, however the swelling quickly returned. She is concerned about having a drain again as she previously had a JP drain after her surgery which she did not like having. She is frustrated that she continues to have recurrent swelling/fluid accumulation after her surgery and states that she was unaware that this could happen. She also expresses frustration that it was never explained to her what this is and why it keeps coming back. Overall she states understanding of indications for procedure and wishes to proceed.   Past Medical History:  Diagnosis Date  . Hypertension     Past Surgical History:  Procedure Laterality Date  . BREAST SURGERY    . IR US GUIDE BX ASP/DRAIN  06/14/2019    Allergies: Patient has no known allergies.  Medications: Prior to Admission medications   Medication Sig Start Date End Date Taking? Authorizing Provider  acetaminophen (TYLENOL) 325 MG tablet Take 650 mg by mouth every 6 (six) hours as needed for mild pain.    [provider]  ibuprofen (ADVIL,MOTRIN) 600 MG tablet Take 1 tablet (600 mg total) by mouth every 8 (eight) hours as needed. 02/26/15   Azalia Austin, Kevin, MD  methocarbamol (ROBAXIN) 500 MG tablet  Take 1 tablet (500 mg total) by mouth every 8 (eight) hours as needed for muscle spasms. 02/26/15   Azalia Austin, Kevin, MD     No family history on file.  Social History   Socioeconomic History  . Marital status: Divorced    Spouse name: Not on file  . Number of children: Not on file  . Years of education: Not on file  . Highest education level: Not on file  Occupational History  . Not on file  Social Needs  . Financial resource strain: Not on file  . Food insecurity    Worry: Not on file    Inability: Not on file  . Transportation needs    Medical: Not on file    Non-medical: Not on file  Tobacco Use  . Smoking status: Never Smoker  . Smokeless tobacco: Never Used  Substance and Sexual Activity  . Alcohol use: No  . Drug use: No  . Sexual activity: Not on file  Lifestyle  . Physical activity    Days per week: Not on file    Minutes per session: Not on file  . Stress: Not on file  Relationships  . Social Musicianconnections    Talks on phone: Not on file    Gets together: Not on file    Attends religious service: Not on file    Active member of club or organization: Not on file    Attends meetings of clubs or organizations: Not on file    Relationship status: Not on file  Other Topics Concern  . Not on file  Social History Narrative  .  Not on file     Review of Systems: A 12 point ROS discussed and pertinent positives are indicated in the HPI above.  All other systems are negative.  Review of Systems  Constitutional: Negative for appetite change, chills and fever.  Respiratory: Negative for cough and shortness of breath.   Cardiovascular: Negative for chest pain.  Gastrointestinal: Positive for abdominal distention and abdominal pain. Negative for diarrhea and nausea.  Musculoskeletal: Negative for back pain.  Skin: Negative for rash and wound.  Neurological: Negative for dizziness and headaches.    Vital Signs: BP 135/89   Pulse 64   Temp 97.7 F (36.5 C) (Skin)    Resp 16   Ht 5\' 6"  (1.676 m)   Wt 182 lb (82.6 kg)   SpO2 100%   BMI 29.38 kg/m   Physical Exam Vitals signs reviewed.  Constitutional:      General: She is not in acute distress. HENT:     Mouth/Throat:     Mouth: Mucous membranes are moist.     Pharynx: Oropharynx is clear. No oropharyngeal exudate or posterior oropharyngeal erythema.  Cardiovascular:     Rate and Rhythm: Normal rate and regular rhythm.  Pulmonary:     Effort: Pulmonary effort is normal.     Breath sounds: Normal breath sounds.  Abdominal:     General: There is distension (midline over abscess).     Palpations: Abdomen is soft.     Tenderness: There is abdominal tenderness (TTP over midline abscess region).  Skin:    General: Skin is warm and dry.  Neurological:     Mental Status: She is alert and oriented to person, place, and time.  Psychiatric:        Mood and Affect: Mood normal.        Behavior: Behavior normal.        Thought Content: Thought content normal.        Judgment: Judgment normal.      MD Evaluation Airway: WNL Heart: WNL Abdomen: WNL Chest/ Lungs: WNL ASA  Classification: 2 Mallampati/Airway Score: Two   Imaging: No results found.  Labs:  CBC: Recent Labs    06/14/19 1011 07/23/19 0642  WBC 4.5 3.8*  HGB 10.7* 11.1*  HCT 33.2* 34.4*  PLT 286 324    COAGS: Recent Labs    06/14/19 1011 07/23/19 0642  INR 1.0 0.9    BMP: No results for input(s): NA, K, CL, CO2, GLUCOSE, BUN, CALCIUM, CREATININE, GFRNONAA, GFRAA in the last 8760 hours.  Invalid input(s): CMP  LIVER FUNCTION TESTS: No results for input(s): BILITOT, AST, ALT, ALKPHOS, PROT, ALBUMIN in the last 8760 hours.  TUMOR MARKERS: No results for input(s): AFPTM, CEA, CA199, CHROMGRNA in the last 8760 hours.  Assessment and Plan:  48 y/o F s/p abdominoplasty 01/21/19 with Dr. 01/23/19 and Traci Hess guided aspiration of abdominal wall hematoma 06/14/19 with Dr. 08/14/19 which yielded approximately  300 cc of thin, reddish brown fluid as well as chunks of dark brown thrombus most consistent with old hematoma who presents today for a repeat image guided aspiration and likely drain placement within the recurrent abdominal wall abscess.   Patient has been NPO since 9 pm last night, she did not take any medications this morning, she does not take blood thinning medications. Afebrile, WBC 3.8, hgb 11.1, plt 324, INR 0.9, UPT (-).  Plan for patient to follow up with our outpatient clinic in 10-14 days for repeat imaging/drain assessment. She will need to  record the output of the drain at least once daily and flush the drain with 5 cc NS QD. I have placed drain care instructions in her discharge summary today.   Risks and benefits discussed with the patient including bleeding, infection, damage to adjacent structures, bowel perforation/fistula connection, and sepsis.  All of the patient's questions were answered, patient is agreeable to proceed.  Consent signed and in chart.  Thank you for this interesting consult.  I greatly enjoyed meeting [employer] and look forward to participating in their care.  A copy of this report was sent to the requesting provider on this date.  Electronically Signed: Joaquim Nam, PA-C 07/23/2019, 8:50 AM   I spent a total of  15 Minutes in face to face in clinical consultation, greater than 50% of which was counseling/coordinating care for abdominal abscess aspiration/possible drain placement.

## 2019-07-23 NOTE — Procedures (Signed)
Interventional Radiology Procedure Note  Procedure: US guided drain placement into the anterior abdominal fluid collection Findings: ~115cc of brown fluid removed. Complex fluid remains .  Complications: None  Recommendations:  - To bulb suction - follow up culture - DC 1 hr - Routine care  Signed,  Dulcy Fanny. Earleen Newport, DO

## 2019-07-23 NOTE — Progress Notes (Signed)
Discharge instructions reviewed with pt. Jp drain care teaching complete pt voices understanding.

## 2019-07-23 NOTE — Discharge Instructions (Signed)
Flush Drainage tube every shift ( three times a day) with 5cc Record drainage every shift   Moderate Conscious Sedation, Adult, Care After These instructions provide you with information about caring for yourself after your procedure. Your health care provider may also give you more specific instructions. Your treatment has been planned according to current medical practices, but problems sometimes occur. Call your health care provider if you have any problems or questions after your procedure. What can I expect after the procedure? After your procedure, it is common:  To feel sleepy for several hours.  To feel clumsy and have poor balance for several hours.  To have poor judgment for several hours.  To vomit if you eat too soon. Follow these instructions at home: For at least 24 hours after the procedure:   Do not: ? Participate in activities where you could fall or become injured. ? Drive. ? Use heavy machinery. ? Drink alcohol. ? Take sleeping pills or medicines that cause drowsiness. ? Make important decisions or sign legal documents. ? Take care of children on your own.  Rest. Eating and drinking  Follow the diet recommended by your health care provider.  If you vomit: ? Drink water, juice, or soup when you can drink without vomiting. ? Make sure you have little or no nausea before eating solid foods. General instructions  Have a responsible adult stay with you until you are awake and alert.  Take over-the-counter and prescription medicines only as told by your health care provider.  If you smoke, do not smoke without supervision.  Keep all follow-up visits as told by your health care provider. This is important. Contact a health care provider if:  You keep feeling nauseous or you keep vomiting.  You feel light-headed.  You develop a rash.  You have a fever. Get help right away if:  You have trouble breathing. This information is not intended to replace  advice given to you by your health care provider. Make sure you discuss any questions you have with your health care provider. Document Released: 06/16/2013 Document Revised: 08/08/2017 Document Reviewed: 12/16/2015 Elsevier Patient Education  2020 [employer] Surgical drains are used to remove extra fluid that normally builds up in a surgical wound after surgery. A surgical drain helps to heal a surgical wound. Different kinds of surgical drains include:  Active drains. These drains use suction to pull drainage away from the surgical wound. Drainage flows through a tube to a container outside of the body. With these drains, you need to keep the bulb or the drainage container flat (compressed) at all times, except while you empty it. Flattening the bulb or container creates suction.  Passive drains. These drains allow fluid to drain naturally, by gravity. Drainage flows through a tube to a bandage (dressing) or a container outside of the body. Passive drains do not need to be emptied. A drain is placed during surgery. Right after surgery, drainage is usually bright red and a little thicker than water. The drainage may gradually turn yellow or pink and become thinner. It is likely that your health care provider will remove the drain when the drainage stops or when the amount decreases to 1-2 Tbsp (15-30 mL) during a 24-hour period. Supplies needed:  Tape.  Germ-free cleaning solution (sterile saline).  Cotton swabs.  Split gauze drain sponge: 4 x 4 inches (10 x 10 cm).  Gauze square: 4 x 4 inches (10 x 10 cm). How to care  for your surgical drain Care for your drain as told by your health care provider. This is important to help prevent infection. If your drain is placed at your back, or any other hard-to-reach area, ask another person to assist you in performing the following tasks: General care  Keep the skin around the drain dry and covered with a dressing at all  times.  Check your drain area every day for signs of infection. Check for: ? Redness, swelling, or pain. ? Pus or a bad smell. ? Cloudy drainage. ? Tenderness or pressure at the drain exit site. Changing the dressing Follow instructions from your health care provider about how to change your dressing. Change your dressing at least once a day. Change it more often if needed to keep the dressing dry. Make sure you: 1. Gather your supplies. 2. Wash your hands with soap and water before you change your dressing. If soap and water are not available, use hand sanitizer. 3. Remove the old dressing. Avoid using scissors to do that. 4. Wash your hands with soap and water again after removing the old dressing. 5. Use sterile saline to clean your skin around the drain. You may need to use a cotton swab to clean the skin. 6. Place the tube through the slit in a drain sponge. Place the drain sponge so that it covers your wound. 7. Place the gauze square or another drain sponge on top of the drain sponge that is on the wound. Make sure the tube is between those layers. 8. Tape the dressing to your skin. 9. Tape the drainage tube to your skin 1-2 inches (2.5-5 cm) below the place where the tube enters your body. Taping keeps the tube from pulling on any stitches (sutures) that you have. 10. Wash your hands with soap and water. 11. Write down the color of your drainage and how often you change your dressing. How to empty your active drain  1. Make sure that you have a measuring cup that you can empty your drainage into. 2. Wash your hands with soap and water. If soap and water are not available, use hand sanitizer. 3. Loosen any pins or clips that hold the tube in place. 4. If your health care provider tells you to strip the tube to prevent clots and tube blockages: ? Hold the tube at the skin with one hand. Use your other hand to pinch the tubing with your thumb and first finger. ? Gently move your  fingers down the tube while squeezing very lightly. This clears any drainage, clots, or tissue from the tube. ? You may need to do this several times each day to keep the tube clear. Do not pull on the tube. 5. Open the bulb cap or the drain plug. Do not touch the inside of the cap or the bottom of the plug. 6. Turn the device upside down and gently squeeze. 7. Empty all of the drainage into the measuring cup. 8. Compress the bulb or the container and replace the cap or the plug. To compress the bulb or the container, squeeze it firmly in the middle while you close the cap or plug the container. 9. Write down the amount of drainage that you have in each 24-hour period. If you have less than 2 Tbsp (30 mL) of drainage during 24 hours, contact your health care provider. 10. Flush the drainage down the toilet. 11. Wash your hands with soap and water. Contact a health care provider if:  You have redness, swelling, or pain around your drain area.  You have pus or a bad smell coming from your drain area.  You have a fever or chills.  The skin around your drain is warm to the touch.  The amount of drainage that you have is increasing instead of decreasing.  You have drainage that is cloudy.  There is a sudden stop or a sudden decrease in the amount of drainage that you have.  Your drain tube falls out.  Your active drain does not stay compressed after you empty it. Summary  Surgical drains are used to remove extra fluid that normally builds up in a surgical wound after surgery.  Different kinds of surgical drains include active drains and passive drains. Active drains use suction to pull drainage away from the surgical wound, and passive drains allow fluid to drain naturally.  It is important to care for your drain to prevent infection. If your drain is placed at your back, or any other hard-to-reach area, ask another person to assist you.  Contact your health care provider if you have  redness, swelling, or pain around your drain area. This information is not intended to replace advice given to you by your health care provider. Make sure you discuss any questions you have with your health care provider. Document Released: 08/23/2000 Document Revised: 09/30/2018 Document Reviewed: 09/30/2018 Elsevier Patient Education  2020 [employer] Surgical Drain Record Empty your surgical drain as told by your health care provider. Use this form to write down the amount of fluid that has collected in the drainage container. Bring this form with you to your follow-up visits. Surgical drain #1 location: ___________________  Date __________ Time __________ Amount __________ Date __________ Time __________ Amount __________ Date __________ Time __________ Amount __________ Date __________ Time __________ Amount __________ Date __________ Time __________ Amount __________ Date __________ Time __________ Amount __________ Date __________ Time __________ Amount __________ Date __________ Time __________ Amount __________ Date __________ Time __________ Amount __________ Date __________ Time __________ Amount __________ Date __________ Time __________ Amount __________ Date __________ Time __________ Amount __________ Date __________ Time __________ Amount __________ Date __________ Time __________ Amount __________ Date __________ Time __________ Amount __________ Date __________ Time __________ Amount __________ Date __________ Time __________ Amount __________ Date __________ Time __________ Amount __________ Date __________ Time __________ Amount __________ Date __________ Time __________ Amount __________ Date __________ Time __________ Amount __________ Surgical drain #2 location: ___________________ Date __________ Time __________ Amount __________ Date __________ Time __________ Amount __________ Date __________ Time __________ Amount __________ Date __________ Time  __________ Amount __________ Date __________ Time __________ Amount __________ Date __________ Time __________ Amount __________ Date __________ Time __________ Amount __________ Date __________ Time __________ Amount __________ Date __________ Time __________ Amount __________ Date __________ Time __________ Amount __________ Date __________ Time __________ Amount __________ Date __________ Time __________ Amount __________ Date __________ Time __________ Amount __________ Date __________ Time __________ Amount __________ Date __________ Time __________ Amount __________ Date __________ Time __________ Amount __________ Date __________ Time __________ Amount __________ Date __________ Time __________ Amount __________ Date __________ Time __________ Amount __________ Date __________ Time __________ Amount __________ Date __________ Time __________ Amount __________ This information is not intended to replace advice given to you by your health care provider. Make sure you discuss any questions you have with your health care provider. Document Released: 06/02/2017 Document Revised: 06/02/2017 Document Reviewed: 06/02/2017 Elsevier Patient Education  2020 [employer].

## 2019-07-28 LAB — AEROBIC/ANAEROBIC CULTURE W GRAM STAIN (SURGICAL/DEEP WOUND)
Culture: NO GROWTH
Gram Stain: NONE SEEN

## 2019-08-04 ENCOUNTER — Ambulatory Visit
Admission: RE | Admit: 2019-08-04 | Discharge: 2019-08-04 | Disposition: A | Discharge status: Home / Self Care | Payer: 59 | Source: Ambulatory Visit | Attending: Plastic Surgery | Admitting: Plastic Surgery

## 2019-08-04 ENCOUNTER — Ambulatory Visit
Admission: RE | Admit: 2019-08-04 | Discharge: 2019-08-04 | Disposition: A | Discharge status: Home / Self Care | Payer: 59 | Source: Ambulatory Visit | Attending: Physician Assistant | Admitting: Physician Assistant

## 2019-08-04 ENCOUNTER — Other Ambulatory Visit: Payer: Self-pay | Admitting: Plastic Surgery

## 2019-08-04 DIAGNOSIS — T8143XA Infection following a procedure, organ and space surgical site, initial encounter: Secondary | ICD-10-CM

## 2019-08-04 DIAGNOSIS — R188 Other ascites: Secondary | ICD-10-CM

## 2019-08-04 HISTORY — PX: IR RADIOLOGIST EVAL & MGMT: IMG5224

## 2019-08-04 MED ORDER — IOPAMIDOL (ISOVUE-300) INJECTION 61%
100.0000 mL | Freq: Once | INTRAVENOUS | Status: AC | PRN
  Administered 2019-08-04: 12:00:00 100 mL via INTRAVENOUS

## 2019-08-04 NOTE — Progress Notes (Signed)
Patient ID: Traci Austin, female   DOB: April 13, 1971, 48 y.o.   MRN: [id number]       Chief Complaint:  Follow-up abdominal wall seroma drain  Referring Physician(s): Watterson,Shannon A  History of Present Illness: Traci Austin is a 48 y.o. female status post abdominal plasty complicated by large abdominal wall subcutaneous aroma.  Patient had a percutaneous drain placed 07/23/2019.  She returns for outpatient imaging and follow-up.  She reports approximately 20 to 30 cc of blood-tinged debris-filled fluid daily from the JP bulb.  Daily flushing at 2-3 times with saline.  She does report some improvement in how her abdomen feels.  There is less distention and pain.  No significant interval fevers or illness.  Past Medical History:  Diagnosis Date   Hypertension     Past Surgical History:  Procedure Laterality Date   BREAST SURGERY     IR RADIOLOGIST EVAL & MGMT  08/04/2019   IR US GUIDE BX ASP/DRAIN  06/14/2019    Allergies: Patient has no known allergies.  Medications: Prior to Admission medications   Medication Sig Start Date End Date Taking? Authorizing Provider  acetaminophen (TYLENOL) 325 MG tablet Take 650 mg by mouth every 6 (six) hours as needed for mild pain.    [provider]  ibuprofen (ADVIL,MOTRIN) 600 MG tablet Take 1 tablet (600 mg total) by mouth every 8 (eight) hours as needed. 02/26/15   Jola Schmidt, MD  methocarbamol (ROBAXIN) 500 MG tablet Take 1 tablet (500 mg total) by mouth every 8 (eight) hours as needed for muscle spasms. 02/26/15   Jola Schmidt, MD     No family history on file.  Social History   Socioeconomic History   Marital status: Divorced    Spouse name: Not on file   Number of children: Not on file   Years of education: Not on file   Highest education level: Not on file  Occupational History   Not on file  Social Needs   Financial resource strain: Not on file   Food insecurity    Worry: Not on file   Inability: Not on file   Transportation needs    Medical: Not on file    Non-medical: Not on file  Tobacco Use   Smoking status: Never Smoker   Smokeless tobacco: Never Used  Substance and Sexual Activity   Alcohol use: No   Drug use: No   Sexual activity: Not on file  Lifestyle   Physical activity    Days per week: Not on file    Minutes per session: Not on file   Stress: Not on file  Relationships   Social connections    Talks on phone: Not on file    Gets together: Not on file    Attends religious service: Not on file    Active member of club or organization: Not on file    Attends meetings of clubs or organizations: Not on file    Relationship status: Not on file  Other Topics Concern   Not on file  Social History Narrative   Not on file     Review of Systems: A 12 point ROS discussed and pertinent positives are indicated in the HPI above.  All other systems are negative.  Review of Systems  Vital Signs: BP (!) 154/77    Pulse 93    Temp 100 F (37.8 C)    SpO2 99%   Physical Exam Constitutional:      General: She is not  in acute distress.    Appearance: She is not toxic-appearing.  Eyes:     General: No scleral icterus.    Conjunctiva/sclera: Conjunctivae normal.  Abdominal:     General: There is no distension.     Tenderness: There is no abdominal tenderness.     Comments: Drain site clean, dry and intact.  Neurological:     General: No focal deficit present.     Mental Status: Mental status is at baseline.  Psychiatric:        Mood and Affect: Mood normal.        Thought Content: Thought content normal.      Imaging: US Guided Needle Placement  Result Date: 07/23/2019 INDICATION: 48 year old female with a history of abdominal plasty and recurrent fluid EXAM: IR ULTRASOUND GUIDED ASPIRATION/DRAINAGE MEDICATIONS: The patient is currently admitted to the hospital and receiving intravenous antibiotics. The antibiotics were administered  within an appropriate time frame prior to the initiation of the procedure. ANESTHESIA/SEDATION: Fentanyl 1.0 mcg IV; Versed 50 mg IV Moderate Sedation Time:  14 minutes The patient was continuously monitored during the procedure by the interventional radiology nurse under my direct supervision. COMPLICATIONS: None PROCEDURE: Informed written consent was obtained from the patient after a thorough discussion of the procedural risks, benefits and alternatives. All questions were addressed. Maximal Sterile Barrier Technique was utilized including caps, mask, sterile gowns, sterile gloves, sterile drape, hand hygiene and skin antiseptic. A timeout was performed prior to the initiation of the procedure. Once the patient is prepped and draped in the usual sterile fashion, 1% lidocaine was used for local anesthesia. Small stab incision was made. Using ultrasound guidance trocar technique was used to place a 10 [place] drain into the transverse component of the anterior abdominal wall fluid collection, infraumbilical. Approximately 115 cc of thin brown fluid removed. Ultrasound demonstrates significant complexity of the fluid with loculations. Catheter sutured in position attached to bulb suction. Patient tolerated the procedure well and remained hemodynamically stable throughout. No complications were encountered and no significant blood loss. IMPRESSION: Status post ultrasound-guided 10 French drain placement into anterior abdominal fluid collection. Signed, Yvone Neu. Reyne Dumas, RPVI [facility] Radiology PLAN: Fluid is somewhat complex, and proceeding immediately with sclerotherapy would not be advised until there is better resolution of the complex fluid collection. Electronically Signed   By: Gilmer Mor D.O.   On: 07/23/2019 10:37   Ct Abdomen Pelvis W Contrast  Result Date: 08/04/2019 CLINICAL DATA:  Abdominal plasty, abdominal wall seroma, status post  percutaneous drain 07/23/2019 EXAM: CT ABDOMEN AND PELVIS WITH CONTRAST TECHNIQUE: Multidetector CT imaging of the abdomen and pelvis was performed using the standard protocol following bolus administration of intravenous contrast. CONTRAST:  ISOVUE-300 IOPAMIDOL (ISOVUE-300) INJECTION 61% COMPARISON:  06/21/2019, 07/23/2019 FINDINGS: Lower chest: Slight increased medial right lower lobe atelectasis noted. Otherwise clear lung bases. Normal heart size. No pericardial or pleural effusion. Hepatobiliary: No focal liver abnormality is seen. No gallstones, gallbladder wall thickening, or biliary dilatation. Pancreas: Unremarkable. No pancreatic ductal dilatation or surrounding inflammatory changes. Spleen: No focal splenic abnormality. Normal in size. Accessory splenule noted inferiorly. Adrenals/Urinary Tract: Normal adrenal glands. No renal obstruction or hydronephrosis. 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 Ureters are symmetric and decompressed. Bladder is collapsed. Stomach/Bowel: Negative for bowel obstruction, significant dilatation, ileus, or free air. Appendix unremarkable. No intra-abdominal or pelvic free fluid, fluid collection, ascites, hemorrhage, hematoma, or abscess. Vascular/Lymphatic: Negative  for aneurysm or acute vascular process. Mesenteric and renal vasculature remain patent. No veno-occlusive process. No bulky adenopathy. Reproductive: IUD in the midline within the endometrial cavity. Dystrophic calcification noted of the uterus lower segment anteriorly compatible with a small calcified fibroid. No adnexal abnormality. No pelvic free fluid. Other: Anterior abdominal wall superficial fluid collection is smaller, measuring at similar locations, collection measures 14 x 2.6 cm, previously 16.7 x 5.1 cm. Drain catheter stable in position. Slight increased wall thickening of the fluid collection with strandy edema throughout the  subcutaneous abdominal wall. Difficult to exclude superimposed infection/cellulitis. Musculoskeletal: No acute or significant osseous finding. IMPRESSION: Smaller but persistent anterior abdominal wall fluid collection compatible with seroma, status post percutaneous drain. Stable drain catheter position. Difficult to exclude superinfection. No new collections. No other acute intra-abdominal or pelvic finding. Electronically Signed   By: Judie PetitM.  Yogi Arther M.D.   On: 08/04/2019 13:01   Ir Radiologist Eval & Mgmt  Result Date: 08/04/2019 Please refer to notes tab for details about interventional procedure. (Op Note)   Labs:  CBC: Recent Labs    06/14/19 1011 07/23/19 0642  WBC 4.5 3.8*  HGB 10.7* 11.1*  HCT 33.2* 34.4*  PLT 286 324    COAGS: Recent Labs    06/14/19 1011 07/23/19 0642  INR 1.0 0.9    BMP: No results for input(s): NA, K, CL, CO2, GLUCOSE, BUN, CALCIUM, CREATININE, GFRNONAA, GFRAA in the last 8760 hours.  Invalid input(s): CMP  LIVER FUNCTION TESTS: No results for input(s): BILITOT, AST, ALT, ALKPHOS, PROT, ALBUMIN in the last 8760 hours.   Assessment and Plan:  CT imaging demonstrates improvement in the anterior abdominal wall seroma status post percutaneous drain.  Stable drain catheter position.  Seroma remains fairly sizable.  There is significant daily output from the JP bulb.  Plan: Continue flushing 3 times daily with JP suction bulb.  Repeat outpatient CT in 2 weeks.  If we are not making any progress, consider drain upsized.  Electronically Signed: Berdine DanceMichael Ramiz Turpin 08/04/2019, 1:47 PM   I spent a total of    25 Minutes in face to face in clinical consultation, greater than 50% of which was counseling/coordinating care for this patient with a postop abdominal wall seroma drain

## 2019-08-18 ENCOUNTER — Ambulatory Visit
Admission: RE | Admit: 2019-08-18 | Discharge: 2019-08-18 | Disposition: A | Discharge status: Home / Self Care | Payer: 59 | Source: Ambulatory Visit | Attending: Plastic Surgery | Admitting: Plastic Surgery

## 2019-08-18 DIAGNOSIS — T8143XA Infection following a procedure, organ and space surgical site, initial encounter: Secondary | ICD-10-CM

## 2019-08-18 DIAGNOSIS — R188 Other ascites: Secondary | ICD-10-CM

## 2019-08-18 HISTORY — PX: IR RADIOLOGIST EVAL & MGMT: IMG5224

## 2019-08-18 MED ORDER — IOPAMIDOL (ISOVUE-300) INJECTION 61%
100.0000 mL | Freq: Once | INTRAVENOUS | Status: AC | PRN
  Administered 2019-08-18: 100 mL via INTRAVENOUS

## 2019-08-18 NOTE — Progress Notes (Signed)
Chief Complaint: F/U Abdominal all drain  Referring Physician(s): Contogiannis,Mary Ann  Supervising Physician: Arne Cleveland  History of Present Illness: Traci Austin is a 48 y.o. female who had an abdominoplasty in May by Dr. Nathanial Rancher.  Her post-op course was complicated by development of an abdominal wall hematoma.  She underwent US guided aspiration by Laurence Ferrari on 06/14/19.  Unfortunately it reoccurred so she had a drain placement by [facility] 11/13..   She is flushing twice a day with a full 10 mL each time.  She has 20-30 mL output daily. The drainage is cloudy pink.  No nausea/vomiting. No Fever/chills. ROS negative. She is not on antibiotics.  CT scan today showed improvement in the fluid collection, but there is a some remaining.  Past Medical History:  Diagnosis Date   Hypertension     Past Surgical History:  Procedure Laterality Date   BREAST SURGERY     IR RADIOLOGIST EVAL & MGMT  08/04/2019   IR US GUIDE BX ASP/DRAIN  06/14/2019    Allergies: Patient has no known allergies.  Medications: Prior to Admission medications   Medication Sig Start Date End Date Taking? Authorizing Provider  acetaminophen (TYLENOL) 325 MG tablet Take 650 mg by mouth every 6 (six) hours as needed for mild pain.    [provider]  ibuprofen (ADVIL,MOTRIN) 600 MG tablet Take 1 tablet (600 mg total) by mouth every 8 (eight) hours as needed. 02/26/15   Jola Schmidt, MD  methocarbamol (ROBAXIN) 500 MG tablet Take 1 tablet (500 mg total) by mouth every 8 (eight) hours as needed for muscle spasms. 02/26/15   Jola Schmidt, MD     No family history on file.  Social History   Socioeconomic History   Marital status: Divorced    Spouse name: Not on file   Number of children: Not on file   Years of education: Not on file   Highest education level: Not on file  Occupational History   Not on file  Social Needs   Financial resource strain: Not on file     Food insecurity    Worry: Not on file    Inability: Not on file   Transportation needs    Medical: Not on file    Non-medical: Not on file  Tobacco Use   Smoking status: Never Smoker   Smokeless tobacco: Never Used  Substance and Sexual Activity   Alcohol use: No   Drug use: No   Sexual activity: Not on file  Lifestyle   Physical activity    Days per week: Not on file    Minutes per session: Not on file   Stress: Not on file  Relationships   Social connections    Talks on phone: Not on file    Gets together: Not on file    Attends religious service: Not on file    Active member of club or organization: Not on file    Attends meetings of clubs or organizations: Not on file    Relationship status: Not on file  Other Topics Concern   Not on file  Social History Narrative   Not on file     Review of Systems: A 12 point ROS discussed and pertinent positives are indicated in the HPI above.  All other systems are negative.  Review of Systems  Vital Signs: BP (!) 149/83 (BP Location: Left Arm)    Pulse 72    SpO2 100%   Physical Exam Constitutional:  Appearance: Normal appearance.  Pulmonary:     Effort: Pulmonary effort is normal. No respiratory distress.  Abdominal:       Comments: Drain remains in place. About 30 mL cloudy pink drainage in bulb.  Neurological:     General: No focal deficit present.     Mental Status: She is alert.  Psychiatric:        Mood and Affect: Mood normal.        Behavior: Behavior normal.        Thought Content: Thought content normal.        Judgment: Judgment normal.       Imaging: [place] Guided Needle Placement  Result Date: 07/23/2019 INDICATION: 48 year old female with a history of abdominal plasty and recurrent fluid EXAM: IR ULTRASOUND GUIDED ASPIRATION/DRAINAGE MEDICATIONS: The patient is currently admitted to the hospital and receiving intravenous antibiotics. The antibiotics were administered within an  appropriate time frame prior to the initiation of the procedure. ANESTHESIA/SEDATION: Fentanyl 1.0 mcg IV; Versed 50 mg IV Moderate Sedation Time:  14 minutes The patient was continuously monitored during the procedure by the interventional radiology nurse under my direct supervision. COMPLICATIONS: None PROCEDURE: Informed written consent was obtained from the patient after a thorough discussion of the procedural risks, benefits and alternatives. All questions were addressed. Maximal Sterile Barrier Technique was utilized including caps, mask, sterile gowns, sterile gloves, sterile drape, hand hygiene and skin antiseptic. A timeout was performed prior to the initiation of the procedure. Once the patient is prepped and draped in the usual sterile fashion, 1% lidocaine was used for local anesthesia. Small stab incision was made. Using ultrasound guidance trocar technique was used to place a 10 [place] drain into the transverse component of the anterior abdominal wall fluid collection, infraumbilical. Approximately 115 cc of thin brown fluid removed. Ultrasound demonstrates significant complexity of the fluid with loculations. Catheter sutured in position attached to bulb suction. Patient tolerated the procedure well and remained hemodynamically stable throughout. No complications were encountered and no significant blood loss. IMPRESSION: Status post ultrasound-guided 10 French drain placement into anterior abdominal fluid collection. Signed, Yvone NeuJaime S. Reyne DumasWagner, DO, RPVI [facility] PLAN: Fluid is somewhat complex, and proceeding immediately with sclerotherapy would not be advised until there is better resolution of the complex fluid collection. Electronically Signed   By: Gilmer MorJaime  Wagner D.O.   On: 07/23/2019 10:37   Ct Abdomen Pelvis W Contrast  Result Date: 08/04/2019 CLINICAL DATA:  Abdominal plasty, abdominal wall seroma, status post percutaneous drain  07/23/2019 EXAM: CT ABDOMEN AND PELVIS WITH CONTRAST TECHNIQUE: Multidetector CT imaging of the abdomen and pelvis was performed using the standard protocol following bolus administration of intravenous contrast. CONTRAST:  100mL ISOVUE-300 IOPAMIDOL (ISOVUE-300) INJECTION 61% COMPARISON:  06/21/2019, 07/23/2019 FINDINGS: Lower chest: Slight increased medial right lower lobe atelectasis noted. Otherwise clear lung bases. Normal heart size. No pericardial or pleural effusion. Hepatobiliary: No focal liver abnormality is seen. No gallstones, gallbladder wall thickening, or biliary dilatation. Pancreas: Unremarkable. No pancreatic ductal dilatation or surrounding inflammatory changes. Spleen: No focal splenic abnormality. Normal in size. Accessory splenule noted inferiorly. Adrenals/Urinary Tract: Normal adrenal glands. No renal obstruction or hydronephrosis. 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 Ureters are symmetric and decompressed. Bladder is collapsed. Stomach/Bowel: Negative for bowel obstruction, significant dilatation, ileus, or free air. Appendix unremarkable. No intra-abdominal or pelvic free fluid, fluid collection, ascites, hemorrhage, hematoma, or abscess. Vascular/Lymphatic: Negative for aneurysm  or acute vascular process. Mesenteric and renal vasculature remain patent. No veno-occlusive process. No bulky adenopathy. Reproductive: IUD in the midline within the endometrial cavity. Dystrophic calcification noted of the uterus lower segment anteriorly compatible with a small calcified fibroid. No adnexal abnormality. No pelvic free fluid. Other: Anterior abdominal wall superficial fluid collection is smaller, measuring at similar locations, collection measures 14 x 2.6 cm, previously 16.7 x 5.1 cm. Drain catheter stable in position. Slight increased wall thickening of the fluid collection with strandy edema throughout the subcutaneous abdominal  wall. Difficult to exclude superimposed infection/cellulitis. Musculoskeletal: No acute or significant osseous finding. IMPRESSION: Smaller but persistent anterior abdominal wall fluid collection compatible with seroma, status post percutaneous drain. Stable drain catheter position. Difficult to exclude superinfection. No new collections. No other acute intra-abdominal or pelvic finding. Electronically Signed   By: Judie Petit.  Shick M.D.   On: 08/04/2019 13:01   Ir Radiologist Eval & Mgmt  Result Date: 08/04/2019 Please refer to notes tab for details about interventional procedure. (Op Note)   Labs:  CBC: Recent Labs    06/14/19 1011 07/23/19 0642  WBC 4.5 3.8*  HGB 10.7* 11.1*  HCT 33.2* 34.4*  PLT 286 324    COAGS: Recent Labs    06/14/19 1011 07/23/19 0642  INR 1.0 0.9    BMP: No results for input(s): NA, K, CL, CO2, GLUCOSE, BUN, CALCIUM, CREATININE, GFRNONAA, GFRAA in the last 8760 hours.  Invalid input(s): CMP  LIVER FUNCTION TESTS: No results for input(s): BILITOT, AST, ALT, ALKPHOS, PROT, ALBUMIN in the last 8760 hours.  TUMOR MARKERS: No results for input(s): AFPTM, CEA, CA199, CHROMGRNA in the last 8760 hours.  Assessment/Plan:  Abdominoplasty in May by Dr. Sherald Hess.  Development of an abdominal wall hematoma with US guided aspiration by Archer Asa on 06/14/19 then drain placement by [facility] 11/13.  CT images reviewed by Dr. Deanne Coffer.  There is improvement in the fluid collection but it has not resolved.  Leave drain in place. Instructed patient to flush with only 3 mL of saline BID.  Return in early January with repeat CT scan.  Electronically Signed: Gwynneth Macleod PA-C 08/18/2019, 3:03 PM    Please refer to Dr. Dereck Ligas attestation of this note for management and plan.

## 2019-09-07 ENCOUNTER — Other Ambulatory Visit: Payer: Self-pay | Admitting: Plastic Surgery

## 2019-09-07 DIAGNOSIS — T8143XA Infection following a procedure, organ and space surgical site, initial encounter: Secondary | ICD-10-CM

## 2019-09-16 ENCOUNTER — Ambulatory Visit
Admission: RE | Admit: 2019-09-16 | Discharge: 2019-09-16 | Disposition: A | Discharge status: Home / Self Care | Payer: Managed Care, Other (non HMO) | Source: Ambulatory Visit | Attending: Plastic Surgery | Admitting: Plastic Surgery

## 2019-09-16 ENCOUNTER — Encounter: Payer: Self-pay | Admitting: *Deleted

## 2019-09-16 DIAGNOSIS — T8143XA Infection following a procedure, organ and space surgical site, initial encounter: Secondary | ICD-10-CM

## 2019-09-16 HISTORY — PX: IR RADIOLOGIST EVAL & MGMT: IMG5224

## 2019-09-16 MED ORDER — IOPAMIDOL (ISOVUE-300) INJECTION 61%
100.0000 mL | Freq: Once | INTRAVENOUS | Status: AC | PRN
  Administered 2019-09-16: 100 mL via INTRAVENOUS

## 2019-09-16 NOTE — Progress Notes (Signed)
Referring Physician(s): Austin,Traci Ann  Chief Complaint: The patient is seen in follow up today s/p post-op abdominal wall hematoma/seroma requiring drain placement 07/23/19  History of present illness:  Traci Austin is a 49 year old female who underwent abdominoplasty in May 2020 with Dr Traci Austin.  She developed an abdominal wall hematoma which was aspirated 06/14/19 by Dr. Archer Austin.  Unfortunately, it recurred and she underwent drain placement 07/23/19 by Dr. Loreta Austin.  Since drain placement, she has returned to IR twice for imaging which has demonstrated residual fluid collection/seroma by CT. She again returns to IR drain clinic today for evaluation of her seroma drain.   Traci Austin reports she has had decreased output from her drain over the past several days.  The color has changed from blood-tinged to yellow. She is continuing to flush daily. She denies fever, chills, abdominal pain, nausea, vomiting.  She is not currently on antibiotics.   Past Medical History:  Diagnosis Date  . Hypertension     Past Surgical History:  Procedure Laterality Date  . BREAST SURGERY    . IR RADIOLOGIST EVAL & MGMT  08/04/2019  . IR RADIOLOGIST EVAL & MGMT  08/18/2019  . IR US GUIDE BX ASP/DRAIN  06/14/2019    Allergies: Patient has no known allergies.  Medications: Prior to Admission medications   Medication Sig Start Date End Date Taking? Authorizing Provider  acetaminophen (TYLENOL) 325 MG tablet Take 650 mg by mouth every 6 (six) hours as needed for mild pain.    [provider]  ibuprofen (ADVIL,MOTRIN) 600 MG tablet Take 1 tablet (600 mg total) by mouth every 8 (eight) hours as needed. 02/26/15   Azalia Bilis, MD  methocarbamol (ROBAXIN) 500 MG tablet Take 1 tablet (500 mg total) by mouth every 8 (eight) hours as needed for muscle spasms. 02/26/15   Azalia Bilis, MD     No family history on file.  Social History   Socioeconomic History  . Marital status:  Divorced    Spouse name: Not on file  . Number of children: Not on file  . Years of education: Not on file  . Highest education level: Not on file  Occupational History  . Not on file  Tobacco Use  . Smoking status: Never Smoker  . Smokeless tobacco: Never Used  Substance and Sexual Activity  . Alcohol use: No  . Drug use: No  . Sexual activity: Not on file  Other Topics Concern  . Not on file  Social History Narrative  . Not on file   Social Determinants of Health   Financial Resource Strain:   . Difficulty of Paying Living Expenses: Not on file  Food Insecurity:   . Worried About Programme researcher, broadcasting/film/video in the Last Year: Not on file  . Ran Out of Food in the Last Year: Not on file  Transportation Needs:   . Lack of Transportation (Medical): Not on file  . Lack of Transportation (Non-Medical): Not on file  Physical Activity:   . Days of Exercise per Week: Not on file  . Minutes of Exercise per Session: Not on file  Stress:   . Feeling of Stress : Not on file  Social Connections:   . Frequency of Communication with Friends and Family: Not on file  . Frequency of Social Gatherings with Friends and Family: Not on file  . Attends Religious Services: Not on file  . Active Member of Clubs or Organizations: Not on file  . Attends Club  or Organization Meetings: Not on file  . Marital Status: Not on file     Vital Signs: There were no vitals taken for this visit.  Physical Exam  NAD, alert Abdomen: soft, mild tenderness at midline.  Drain in place in left mid abdomen.  Insertion site intact with small amount of drainage around the tubing. Beige/yellow output in bulb. ~20 mL.   Imaging: No results found.  Labs:  CBC: Recent Labs    06/14/19 1011 07/23/19 0642  WBC 4.5 3.8*  HGB 10.7* 11.1*  HCT 33.2* 34.4*  PLT 286 324    COAGS: Recent Labs    06/14/19 1011 07/23/19 0642  INR 1.0 0.9    BMP: No results for input(s): NA, K, CL, CO2, GLUCOSE, BUN, CALCIUM,  CREATININE, GFRNONAA, GFRAA in the last 8760 hours.  Invalid input(s): CMP  LIVER FUNCTION TESTS: No results for input(s): BILITOT, AST, ALT, ALKPHOS, PROT, ALBUMIN in the last 8760 hours.  Assessment: Abdominoplasty 02/2946 complicated by post-operative abdominal wall hematoma/seroma with recurrence.  Now s/p aspiration 06/14/19, and drain placement 07/23/19 Traci Austin returns to IR for follow-up of her abdominal drain.  She reports improvement in the amount of drainage.  She has not emptied the drain in 1-2 days and has about 20 mL present so far today.   CT Abdomen Pelvis performed and reviewed by Dr. Kathlene Cote who notes improvement in collection.   Dr. Kathlene Cote recommends no further CT imaging, close monitoring of output volume at home, with return visit for assessment and drain removal once output in <10 mL for consecutive days at home.  Stop flushes.  Patient given care instructions and verbalizes understanding of plan.  She will call our office to schedule an appointment once her output is <10 mL.  Signed: Docia Barrier, PA 09/16/2019, 1:43 PM   Please refer to Dr. Kathlene Cote attestation of this note for management and plan.

## 2019-09-24 ENCOUNTER — Other Ambulatory Visit: Payer: Self-pay | Admitting: Plastic Surgery

## 2019-09-24 DIAGNOSIS — S301XXD Contusion of abdominal wall, subsequent encounter: Secondary | ICD-10-CM

## 2019-09-29 ENCOUNTER — Encounter: Payer: Self-pay | Admitting: *Deleted

## 2019-09-29 ENCOUNTER — Ambulatory Visit
Admission: RE | Admit: 2019-09-29 | Discharge: 2019-09-29 | Disposition: A | Discharge status: Home / Self Care | Payer: Managed Care, Other (non HMO) | Source: Ambulatory Visit | Attending: Plastic Surgery | Admitting: Plastic Surgery

## 2019-09-29 DIAGNOSIS — S301XXD Contusion of abdominal wall, subsequent encounter: Secondary | ICD-10-CM

## 2019-09-29 HISTORY — PX: IR RADIOLOGIST EVAL & MGMT: IMG5224

## 2019-09-29 NOTE — Progress Notes (Signed)
Referring Physician(s): Austin,Traci Ann  Chief Complaint: The patient is seen in follow up today s/p post-op abdominal wall hematoma/seroma requiring drain placement 07/23/19  History of present illness:  Traci Austin is a 49 year old female who underwent abdominoplasty in May 2020 with Dr Traci Austin.  She developed an abdominal wall hematoma which was aspirated 06/14/19 by Dr. Laurence Austin, ultimately requiring drainage 07/23/19 by Dr. Earleen Austin. At her most recent IR drain clinic appointment with Dr. Kathlene Austin 09/15/18, her imaging showed near complete resolution with decreased daily output.  Plan was made for the patient to stop flushes and return once her output was <10 mL/day.   Traci Austin presents to drain clinic today citing decreased output from her drain over the past several days. She is now having a minimal amount of yellow drainage daily. She denies fever, chills, abdominal pain, nausea, vomiting.  She is not currently on antibiotics.   Past Medical History:  Diagnosis Date  . Hypertension     Past Surgical History:  Procedure Laterality Date  . BREAST SURGERY    . IR RADIOLOGIST EVAL & MGMT  08/04/2019  . IR RADIOLOGIST EVAL & MGMT  08/18/2019  . IR RADIOLOGIST EVAL & MGMT  09/16/2019  . IR US GUIDE BX ASP/DRAIN  06/14/2019    Allergies: Patient has no known allergies.  Medications: Prior to Admission medications   Medication Sig Start Date End Date Taking? Authorizing Provider  acetaminophen (TYLENOL) 325 MG tablet Take 650 mg by mouth every 6 (six) hours as needed for mild pain.    [provider]  ibuprofen (ADVIL,MOTRIN) 600 MG tablet Take 1 tablet (600 mg total) by mouth every 8 (eight) hours as needed. 02/26/15   Jola Schmidt, MD  methocarbamol (ROBAXIN) 500 MG tablet Take 1 tablet (500 mg total) by mouth every 8 (eight) hours as needed for muscle spasms. 02/26/15   Jola Schmidt, MD     No family history on file.  Social History   Socioeconomic  History  . Marital status: Divorced    Spouse name: Not on file  . Number of children: Not on file  . Years of education: Not on file  . Highest education level: Not on file  Occupational History  . Not on file  Tobacco Use  . Smoking status: Never Smoker  . Smokeless tobacco: Never Used  Substance and Sexual Activity  . Alcohol use: No  . Drug use: No  . Sexual activity: Not on file  Other Topics Concern  . Not on file  Social History Narrative  . Not on file   Social Determinants of Health   Financial Resource Strain:   . Difficulty of Paying Living Expenses: Not on file  Food Insecurity:   . Worried About Charity fundraiser in the Last Year: Not on file  . Ran Out of Food in the Last Year: Not on file  Transportation Needs:   . Lack of Transportation (Medical): Not on file  . Lack of Transportation (Non-Medical): Not on file  Physical Activity:   . Days of Exercise per Week: Not on file  . Minutes of Exercise per Session: Not on file  Stress:   . Feeling of Stress : Not on file  Social Connections:   . Frequency of Communication with Friends and Family: Not on file  . Frequency of Social Gatherings with Friends and Family: Not on file  . Attends Religious Services: Not on file  . Active Member of Clubs or Organizations:  Not on file  . Attends Banker Meetings: Not on file  . Marital Status: Not on file     Vital Signs: BP (!) 151/92 (BP Location: Right Arm)   Pulse 76   Temp 98.7 F (37.1 C)   SpO2 99%   Physical Exam  NAD, alert Abdomen: soft, non-distending.  Left lateral drain in place.  Insertion site c/d/i.  Small amount of yellow drainage in bulb.   Imaging: No results found.  Labs:  CBC: Recent Labs    06/14/19 1011 07/23/19 0642  WBC 4.5 3.8*  HGB 10.7* 11.1*  HCT 33.2* 34.4*  PLT 286 324    COAGS: Recent Labs    06/14/19 1011 07/23/19 0642  INR 1.0 0.9    BMP: No results for input(s): NA, K, CL, CO2, GLUCOSE,  BUN, CALCIUM, CREATININE, GFRNONAA, GFRAA in the last 8760 hours.  Invalid input(s): CMP  LIVER FUNCTION TESTS: No results for input(s): BILITOT, AST, ALT, ALKPHOS, PROT, ALBUMIN in the last 8760 hours.  Assessment: Abdominoplasty 01/2019 complicated by post-operative abdominal wall hematoma/seroma with recurrence.  Now s/p aspiration 06/14/19, and drain placement 07/23/19 Patient has returned to IR drain clinic today for possible drain removal.   She has not been flushing the drain over the past 2 weeks per our instructions.  Denies fevers, chills, abdominal pain, nausea.   Her drain output has decreased.  She requests drain removal. Given her recent improvement by imaging and continued minimal drainage, Dr. Lowella Dandy feels this is appropriate..  She is educated on the potential of recurrence and given instructions to contact her surgeon should symptoms recur.  Drain removed in its entirety without complication.  Dressing applied and care instructions given. She verbalizes understanding.   Signed: Hoyt Koch, PA 09/29/2019, 3:04 PM   Please refer to Dr. Manon Hilding attestation of this note for management and plan.

## 2021-03-13 ENCOUNTER — Other Ambulatory Visit (HOSPITAL_BASED_OUTPATIENT_CLINIC_OR_DEPARTMENT_OTHER): Payer: Self-pay

## 2021-03-13 ENCOUNTER — Other Ambulatory Visit: Payer: Self-pay

## 2021-03-13 ENCOUNTER — Emergency Department (HOSPITAL_BASED_OUTPATIENT_CLINIC_OR_DEPARTMENT_OTHER)
Admission: EM | Admit: 2021-03-13 | Discharge: 2021-03-13 | Disposition: A | Discharge status: Home / Self Care | Payer: 59 | Attending: Emergency Medicine | Admitting: Emergency Medicine

## 2021-03-13 DIAGNOSIS — Y92410 Unspecified street and highway as the place of occurrence of the external cause: Secondary | ICD-10-CM | POA: Diagnosis not present

## 2021-03-13 DIAGNOSIS — I10 Essential (primary) hypertension: Secondary | ICD-10-CM | POA: Diagnosis not present

## 2021-03-13 DIAGNOSIS — M542 Cervicalgia: Secondary | ICD-10-CM | POA: Insufficient documentation

## 2021-03-13 MED ORDER — ACETAMINOPHEN 325 MG PO TABS
650.0000 mg | ORAL_TABLET | Freq: Once | ORAL | Status: AC
  Administered 2021-03-13: 650 mg via ORAL
  Filled 2021-03-13: qty 2

## 2021-03-13 MED ORDER — CYCLOBENZAPRINE HCL 10 MG PO TABS
10.0000 mg | ORAL_TABLET | Freq: Every day | ORAL | 0 refills | Status: AC
  Filled 2021-03-13: qty 10, 10d supply, fill #0
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
## 2021-12-01 IMAGING — CT CT ABD-PELV W/ CM
2 of 4 series · 14 of 46 positions shown, 16 images · IV contrast (iopamidol)
Comparison: 08/04/2019 and previous

CLINICAL DATA: Large subcutaneous fluid collection post
abdominoplasty, status post percutaneous drain catheter placement
07/23/2019, continued moderate output

EXAM:
CT ABDOMEN AND PELVIS WITH CONTRAST
TECHNIQUE: Multidetector CT imaging of the abdomen and pelvis was performed
using the standard protocol following bolus administration of
intravenous contrast.
CONTRAST:  100mL 8IBQOT-SBB IOPAMIDOL (8IBQOT-SBB) INJECTION 61%

[Series 2: abd pelvis 5.00 br40 s3 axial · axial · 0.70mm/px · z∈[+1450,+1840]mm · 11 of 94 slices shown, 13 images]
[im 8/94  soft-tissue]
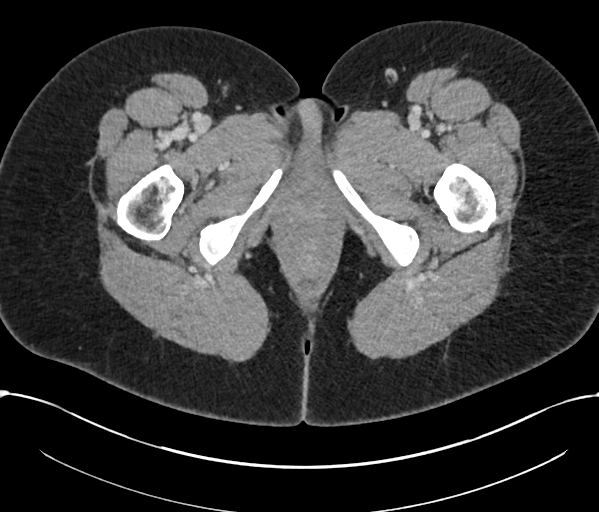
[im 8/94  bone]
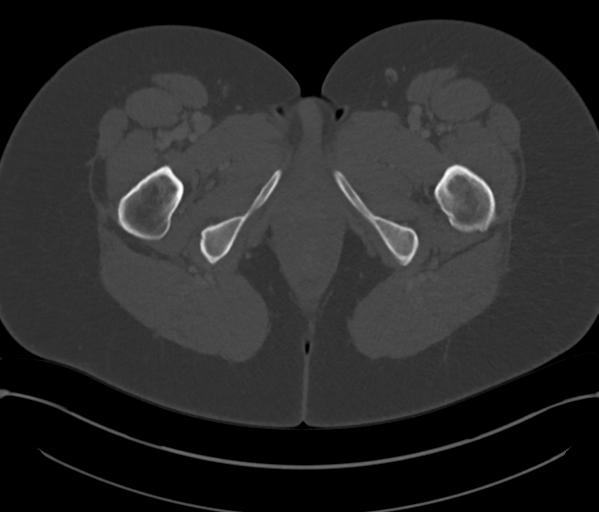
[im 16/94  soft-tissue]
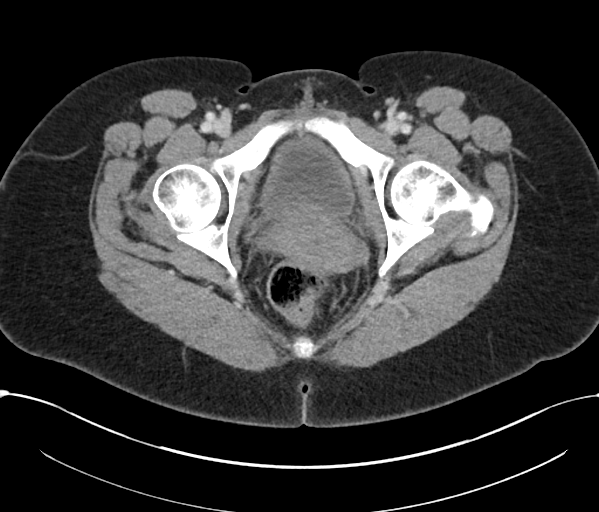
[im 24/94  soft-tissue]
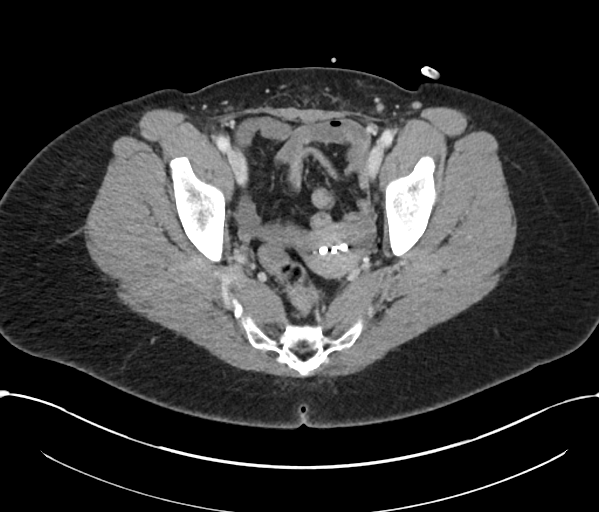
[im 32/94  soft-tissue]
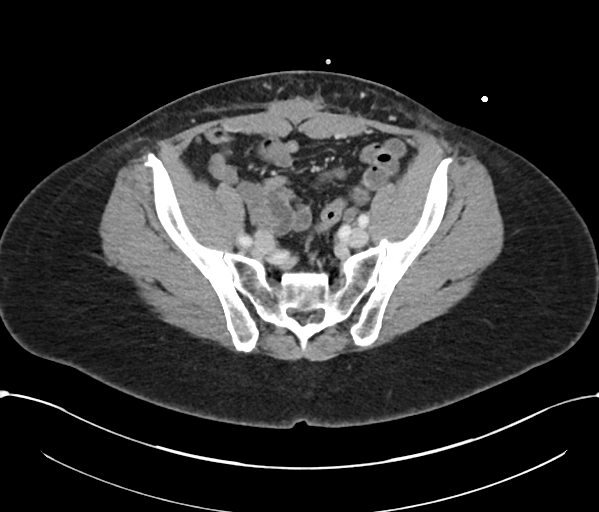
[im 39/94  soft-tissue]
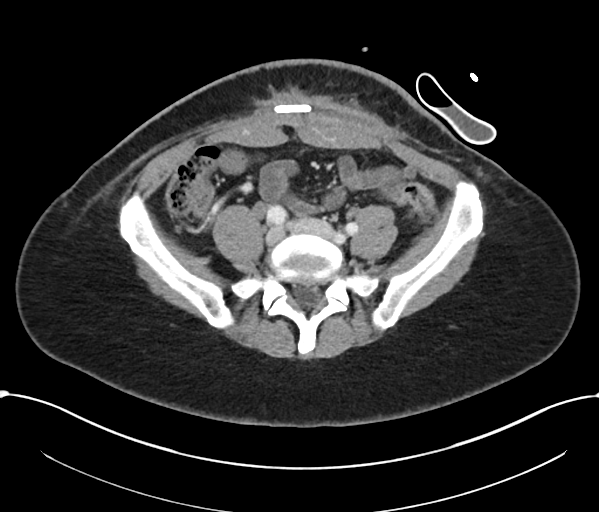
[im 47/94  soft-tissue]
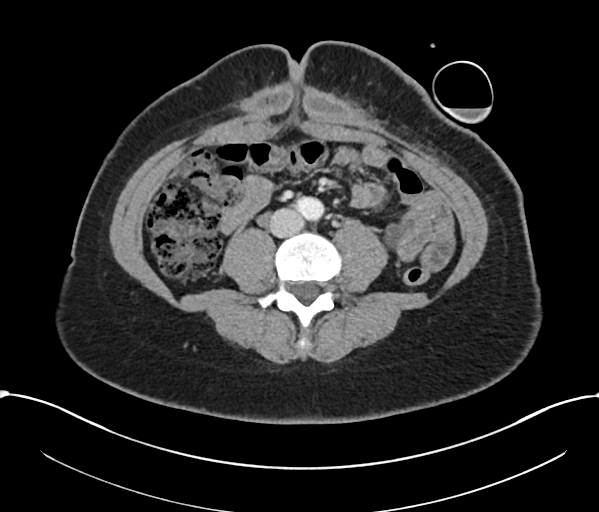
[im 55/94  soft-tissue]
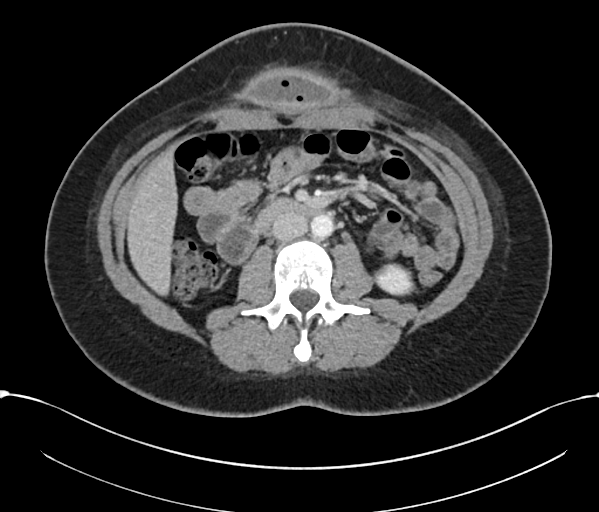
[im 63/94  soft-tissue]
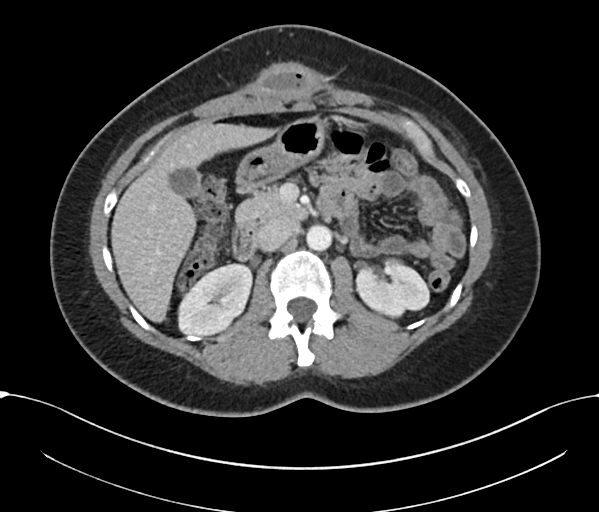
[im 70/94  soft-tissue]
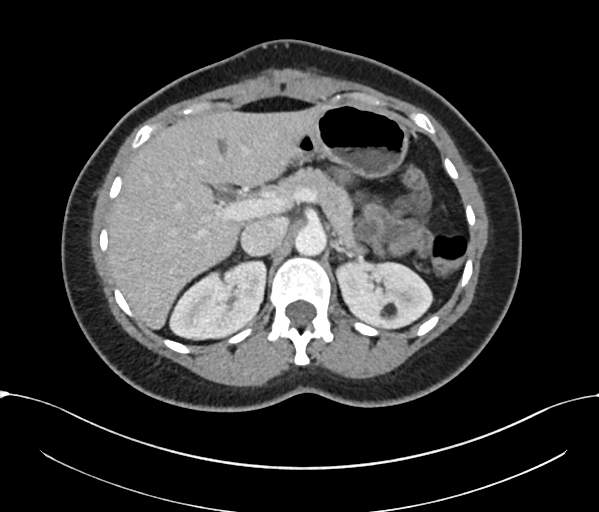
[im 70/94  bone]
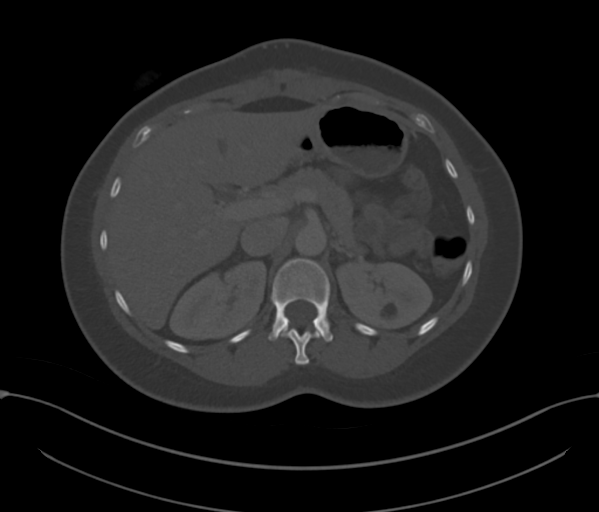
[im 78/94  soft-tissue]
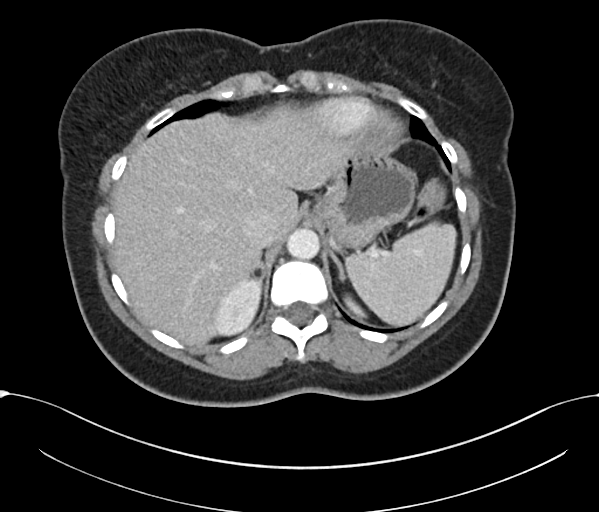
[im 86/94  soft-tissue]
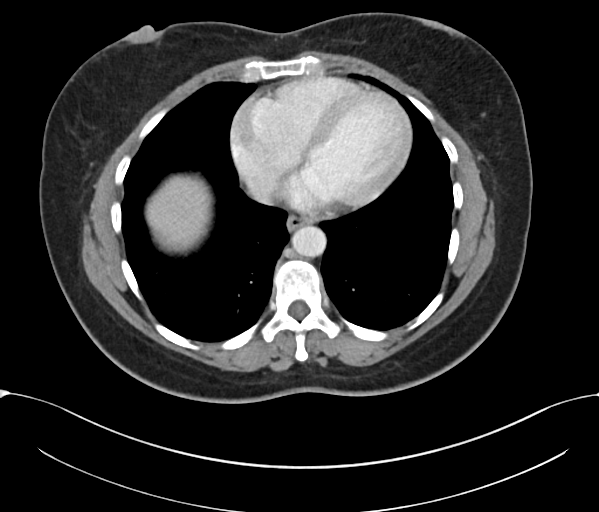

[Series 6: abd pelvis 2.00 br40 s3 cor · coronal · 0.82mm/px · 3 of 151 slices shown]
[im 51/151  soft-tissue]
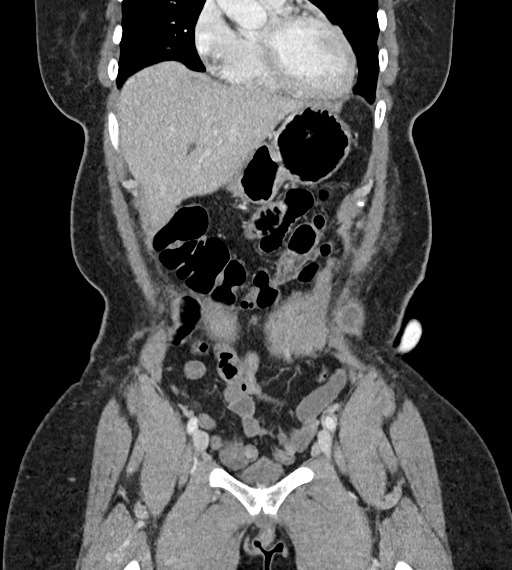
[im 67/151  soft-tissue]
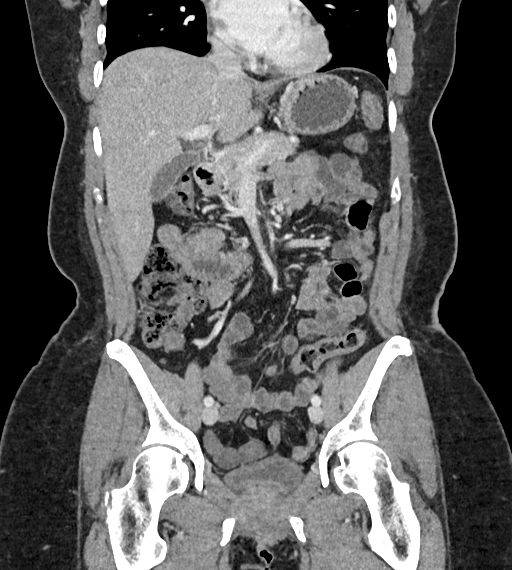
[im 84/151  soft-tissue]
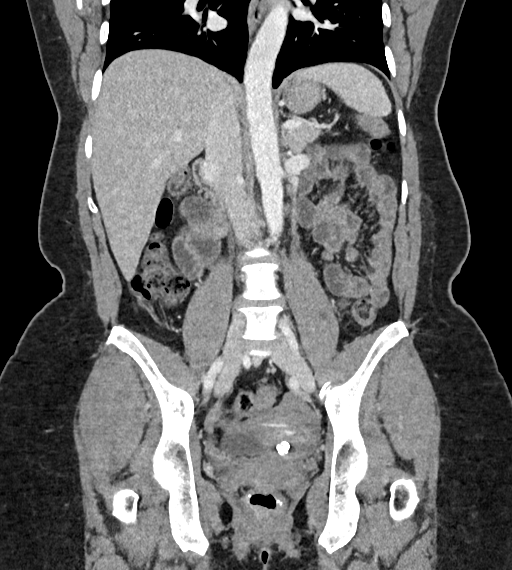

[14 of 46 positions shown; findings below may reference images not displayed]

FINDINGS: Lower chest: No acute abnormality.

Hepatobiliary: No focal liver abnormality is seen. No gallstones,
gallbladder wall thickening, or biliary dilatation.

Pancreas: Unremarkable. No pancreatic ductal dilatation or
surrounding inflammatory changes.

Spleen: Normal in size without focal abnormality.

Adrenals/Urinary Tract: Adrenal glands unremarkable. Normal
bilateral renal enhancement. No hydronephrosis. Stable 2 cm left
renal benign angiomyolipoma. Urinary bladder nondistended.

Stomach/Bowel: Stomach is incompletely distended. Small bowel is
nondilated. Normal appendix. The colon is nondilated. Staple line in
the distal rectum.

Vascular/Lymphatic: No significant vascular findings are present. No
enlarged abdominal or pelvic lymph nodes.

Reproductive: Calcified uterine fibroids. IUD in place. No adnexal
mass.

Other: Bilateral pelvic phleboliths.  No ascites.  No free air.

Musculoskeletal: Lenticular fluid collection in the deep
subcutaneous tissues of the anterior abdominal wall about the
umbilicus, 12.2 x 0.8 cm transverse dimensions (previously 14 x 2.6)
at the level of the drain catheter inferior to the umbilicus, with a
cephalad component measuring approximately 4.5 x 2 cm (previously
7.1 x 2.7 cm by my measurement). There are scattered gas bubbles
within the residual components. No new or enlarging component.

Regional bones unremarkable.  No fracture or worrisome bone lesion.
IMPRESSION: 1. Interval partial resolution of deep subcutaneous collection in
the anterior abdominal wall, with moderate residual as above. The
drain catheter is in good position.
2. No acute intra-abdominal pathology.

## 2022-03-23 LAB — GLUCOSE, POCT (MANUAL RESULT ENTRY): POC Glucose: 106 mg/dl — AB (ref 70–99)
